# Patient Record
Sex: Female | Born: 1995 | Race: Black or African American | Hispanic: No | Marital: Single | Smoking: Never smoker
Health system: Southern US, Community
[De-identification: ages and names within clinical notes are randomized; demographics above are authoritative.]

## PROBLEM LIST (undated history)

## (undated) ENCOUNTER — Emergency Department (HOSPITAL_BASED_OUTPATIENT_CLINIC_OR_DEPARTMENT_OTHER): Discharge status: Absent without leave | Payer: BLUE CROSS/BLUE SHIELD

## (undated) ENCOUNTER — Inpatient Hospital Stay (HOSPITAL_COMMUNITY): Payer: Self-pay

## (undated) ENCOUNTER — Emergency Department (HOSPITAL_BASED_OUTPATIENT_CLINIC_OR_DEPARTMENT_OTHER): Admission: EM | Payer: Medicaid Other | Source: Home / Self Care

## (undated) DIAGNOSIS — Z789 Other specified health status: Secondary | ICD-10-CM

## (undated) DIAGNOSIS — B009 Herpesviral infection, unspecified: Secondary | ICD-10-CM

## (undated) DIAGNOSIS — F419 Anxiety disorder, unspecified: Secondary | ICD-10-CM

## (undated) HISTORY — PX: MOUTH SURGERY: SHX715

---
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
## 2014-03-28 IMAGING — CR DG CHEST 2V
2 series · 2 of 2 positions shown · non-contrast
Comparison: None.

CLINICAL DATA: Shortness of breath

CHEST - 2 VIEW

[view not recorded (1 of 2)]
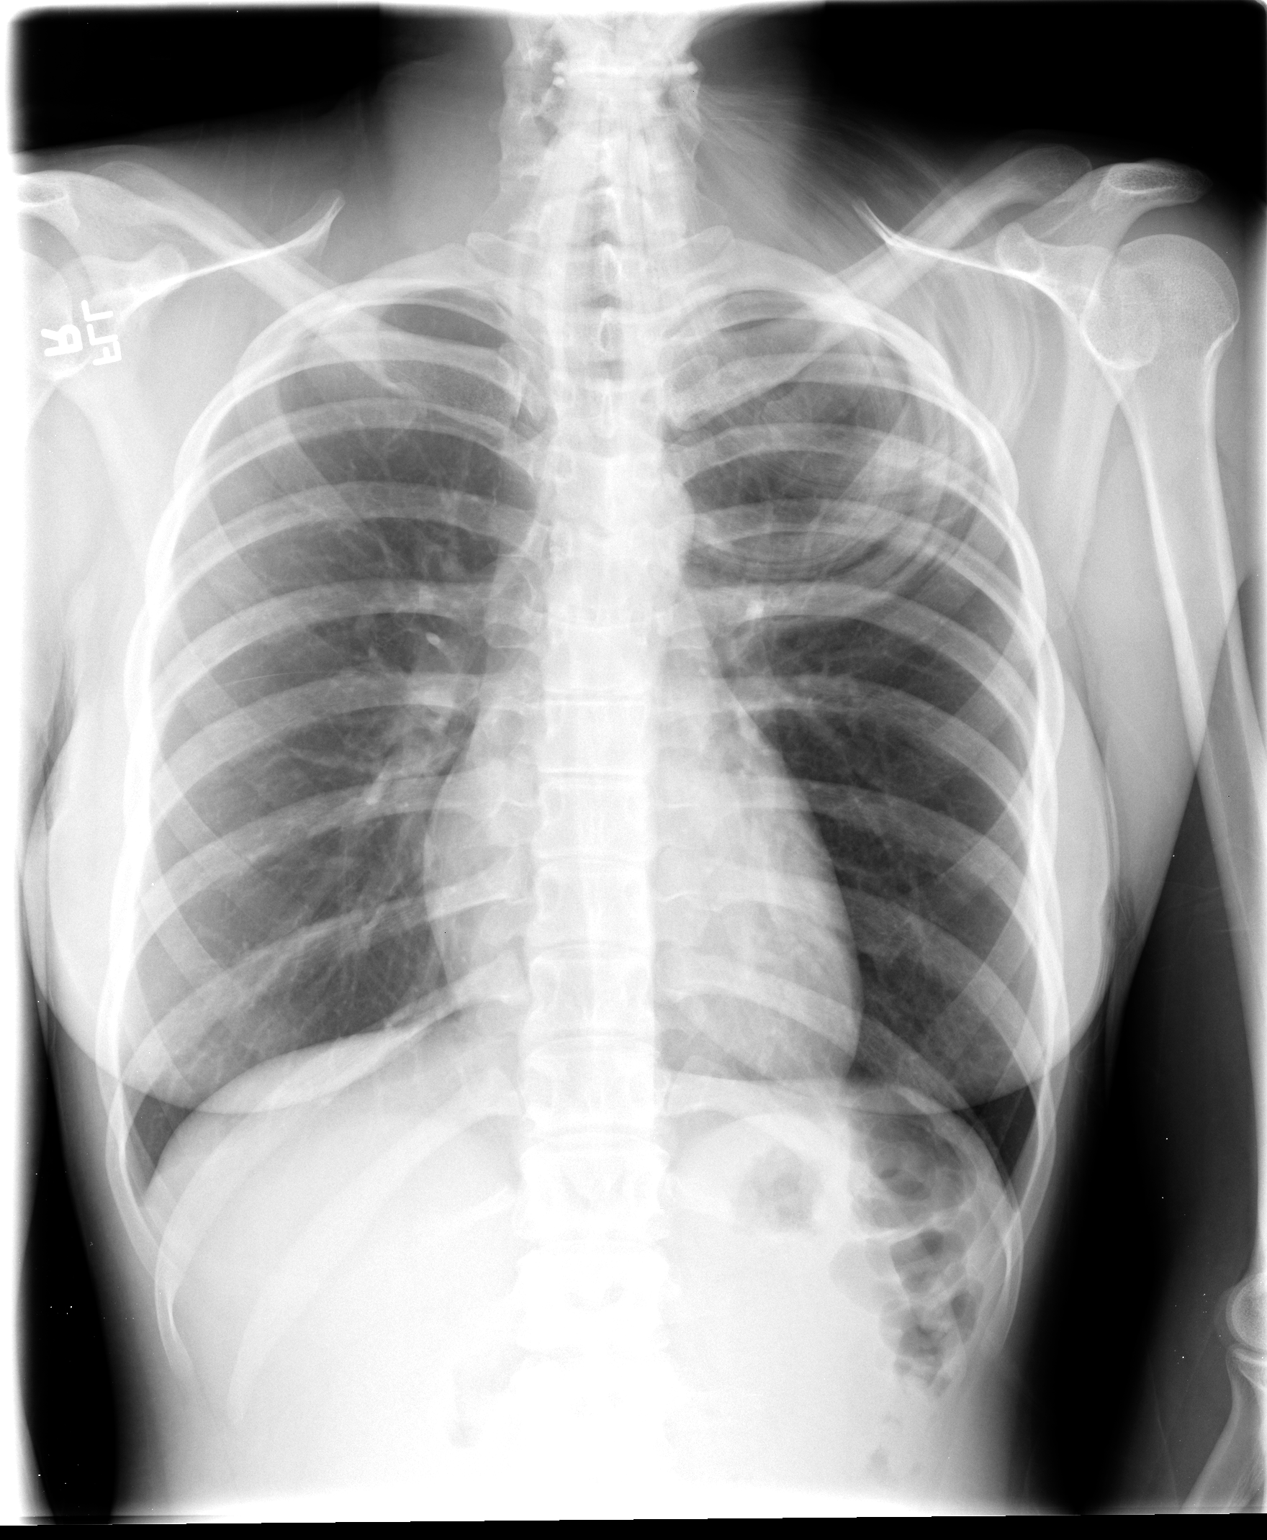

[view not recorded (2 of 2)]
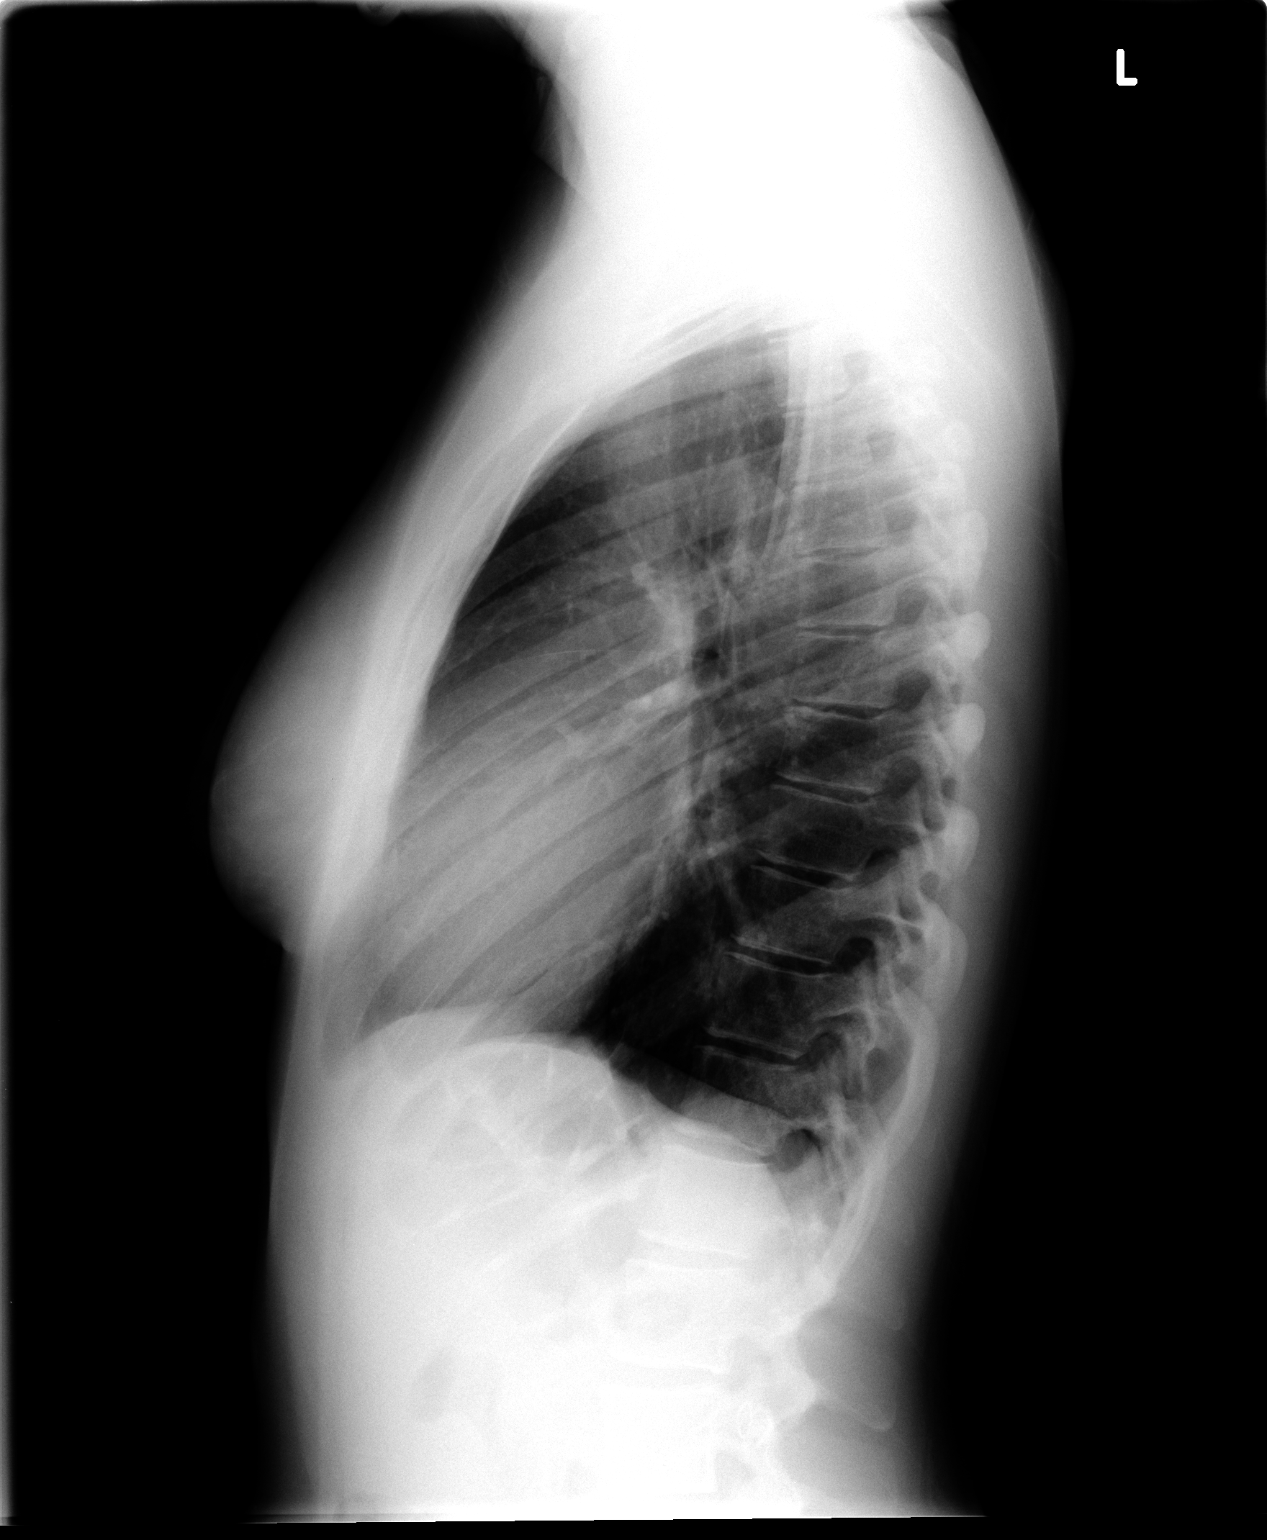

[2 of 2 positions shown; findings below may reference images not displayed]

FINDINGS: The heart and pulmonary vascularity are within normal
limits.  The lungs are clear bilaterally.  No acute bony
abnormality is seen.
IMPRESSION: No acute abnormality noted.

## 2014-06-03 ENCOUNTER — Emergency Department (HOSPITAL_COMMUNITY)
Admission: EM | Admit: 2014-06-03 | Discharge: 2014-06-03 | Disposition: A | Discharge status: Home / Self Care | Payer: BC Managed Care – PPO | Attending: Emergency Medicine | Admitting: Emergency Medicine

## 2014-06-03 DIAGNOSIS — R102 Pelvic and perineal pain: Secondary | ICD-10-CM | POA: Diagnosis present

## 2014-06-03 DIAGNOSIS — Z3202 Encounter for pregnancy test, result negative: Secondary | ICD-10-CM | POA: Insufficient documentation

## 2014-06-03 DIAGNOSIS — N939 Abnormal uterine and vaginal bleeding, unspecified: Secondary | ICD-10-CM

## 2014-06-03 LAB — COMPREHENSIVE METABOLIC PANEL
ALT: 6 U/L (ref 0–35)
ANION GAP: 10 (ref 5–15)
AST: 16 U/L (ref 0–37)
Albumin: 4.1 g/dL (ref 3.5–5.2)
Alkaline Phosphatase: 64 U/L (ref 39–117)
BUN: 9 mg/dL (ref 6–23)
CALCIUM: 9.1 mg/dL (ref 8.4–10.5)
CO2: 27 meq/L (ref 19–32)
CREATININE: 0.86 mg/dL (ref 0.50–1.10)
Chloride: 102 mEq/L (ref 96–112)
GLUCOSE: 82 mg/dL (ref 70–99)
Potassium: 3.9 mEq/L (ref 3.7–5.3)
Sodium: 139 mEq/L (ref 137–147)
TOTAL PROTEIN: 7.4 g/dL (ref 6.0–8.3)
Total Bilirubin: 0.9 mg/dL (ref 0.3–1.2)

## 2014-06-03 LAB — CBC WITH DIFFERENTIAL/PLATELET
Basophils Absolute: 0 10*3/uL (ref 0.0–0.1)
Basophils Relative: 1 % (ref 0–1)
EOS ABS: 0.1 10*3/uL (ref 0.0–0.7)
EOS PCT: 2 % (ref 0–5)
HEMATOCRIT: 34 % — AB (ref 36.0–46.0)
Hemoglobin: 11 g/dL — ABNORMAL LOW (ref 12.0–15.0)
LYMPHS ABS: 1.7 10*3/uL (ref 0.7–4.0)
LYMPHS PCT: 33 % (ref 12–46)
MCH: 26.4 pg (ref 26.0–34.0)
MCHC: 32.4 g/dL (ref 30.0–36.0)
MCV: 81.5 fL (ref 78.0–100.0)
MONO ABS: 0.5 10*3/uL (ref 0.1–1.0)
MONOS PCT: 10 % (ref 3–12)
Neutro Abs: 2.9 10*3/uL (ref 1.7–7.7)
Neutrophils Relative %: 54 % (ref 43–77)
Platelets: 218 10*3/uL (ref 150–400)
RBC: 4.17 MIL/uL (ref 3.87–5.11)
RDW: 14.8 % (ref 11.5–15.5)
WBC: 5.2 10*3/uL (ref 4.0–10.5)

## 2014-06-03 LAB — URINALYSIS, ROUTINE W REFLEX MICROSCOPIC
BILIRUBIN URINE: NEGATIVE
Glucose, UA: NEGATIVE mg/dL
HGB URINE DIPSTICK: NEGATIVE
KETONES UR: NEGATIVE mg/dL
Leukocytes, UA: NEGATIVE
NITRITE: NEGATIVE
PH: 6.5 (ref 5.0–8.0)
Protein, ur: NEGATIVE mg/dL
SPECIFIC GRAVITY, URINE: 1.01 (ref 1.005–1.030)
UROBILINOGEN UA: 0.2 mg/dL (ref 0.0–1.0)

## 2014-06-03 LAB — WET PREP, GENITAL
Trich, Wet Prep: NONE SEEN
YEAST WET PREP: NONE SEEN

## 2014-06-03 LAB — POC URINE PREG, ED: Preg Test, Ur: NEGATIVE

## 2014-06-03 MED ORDER — HYDROCODONE-ACETAMINOPHEN 5-325 MG PO TABS: 2.0000 | ORAL_TABLET | ORAL | Status: DC | PRN

## 2014-06-03 MED ORDER — MEDROXYPROGESTERONE ACETATE 5 MG PO TABS: 5.0000 mg | ORAL_TABLET | Freq: Every day | ORAL | Status: DC

## 2014-06-03 MED ORDER — IBUPROFEN 800 MG PO TABS: 800.0000 mg | ORAL_TABLET | Freq: Three times a day (TID) | ORAL | Status: DC

## 2014-06-03 NOTE — ED Notes (Signed)
Low pelvic pain for 24 hours, no UTI sx. Denies fever, nausea vomiting

## 2014-06-03 NOTE — ED Provider Notes (Signed)
CSN: [id number]     Arrival date & time 06/03/14  1833 History  This chart was scribe for Gilda Creasehristopher J. Anakaren Campion, * by Angelene GiovanniEmmanuella Mensah, ED Scribe. The patient was seen in room APA09/APA09 and the patient's care was started at 8:37 PM.    Chief Complaint  Patient presents with  . Pelvic Pain   The history is provided by the patient. No language interpreter was used.   HPI Comments: Patricia Craig is a 18 y.o. female who presents to the Emergency Department complaining of a sharp constant pelvic pain onset today. She reports associated blood clots after her period that ended 2 days ago. She reports taking an ibuprofen today with no relief. She states that she has been experiencing these symptoms for the past few months.    No past medical history on file. No past surgical history on file. No family history on file. History  Substance Use Topics  . Smoking status: Not on file  . Smokeless tobacco: Not on file  . Alcohol Use: No   OB History    No data available     Review of Systems  Constitutional: Negative for fever.  Gastrointestinal: Negative for nausea and vomiting.  Genitourinary: Positive for pelvic pain.      Allergies  Review of patient's allergies indicates no known allergies.  Home Medications   Prior to Admission medications   Medication Sig Start Date End Date Taking? Authorizing Provider  ALPRAZolam (XANAX) 0.25 MG tablet Take 1 tablet (0.25 mg total) by mouth 3 (three) times daily as needed for anxiety. Patient not taking: Reported on 06/03/2014 10/23/12   Burgess AmorJulie Idol, PA-C   BP 106/68 mmHg  Pulse 57  Temp(Src) 98.3 F (36.8 C) (Oral)  Resp 24  Ht 5\' 2"  (1.575 m)  Wt 119 lb (53.978 kg)  BMI 21.76 kg/m2  SpO2   LMP 05/24/2014 (Approximate) Physical Exam  Constitutional: She is oriented to person, place, and time. She appears well-developed and well-nourished. No distress.  HENT:  Head: Normocephalic and atraumatic.  Right Ear: Hearing normal.   Left Ear: Hearing normal.  Nose: Nose normal.  Mouth/Throat: Oropharynx is clear and moist and mucous membranes are normal.  Eyes: Conjunctivae and EOM are normal. Pupils are equal, round, and reactive to light.  Neck: Normal range of motion. Neck supple.  Cardiovascular: Regular rhythm, S1 normal and S2 normal.  Exam reveals no gallop and no friction rub.   No murmur heard. Pulmonary/Chest: Effort normal and breath sounds normal. No respiratory distress. She exhibits no tenderness.  Abdominal: Soft. Normal appearance and bowel sounds are normal. There is no hepatosplenomegaly. There is tenderness in the suprapubic area. There is no rebound, no guarding, no tenderness at McBurney's point and negative Murphy's sign. No hernia.  Genitourinary: Vagina normal. There is no rash on the right labia. There is no rash on the left labia. Uterus is tender. Uterus is not enlarged. Cervix exhibits no motion tenderness. Right adnexum displays no mass. Left adnexum displays no mass.  Musculoskeletal: Normal range of motion.  Neurological: She is alert and oriented to person, place, and time. She has normal strength. No cranial nerve deficit or sensory deficit. Coordination normal. GCS eye subscore is 4. GCS verbal subscore is 5. GCS motor subscore is 6.  Skin: Skin is warm, dry and intact. No rash noted. No cyanosis.  Psychiatric: She has a normal mood and affect. Her speech is normal and behavior is normal. Thought content normal.  Nursing note and  vitals reviewed.   ED Course  Procedures (including critical care time) DIAGNOSTIC STUDIES:   COORDINATION OF CARE: 8:40 PM- Pt advised of plan for treatment and pt agrees.    Labs Review Labs Reviewed  CBC WITH DIFFERENTIAL - Abnormal; Notable for the following:    Hemoglobin 11.0 (*)    HCT 34.0 (*)    All other components within normal limits  WET PREP, GENITAL  GC/CHLAMYDIA PROBE AMP  COMPREHENSIVE METABOLIC PANEL  URINALYSIS, ROUTINE W REFLEX  MICROSCOPIC  TSH  RPR  HIV ANTIBODY (ROUTINE TESTING)  POC URINE PREG, ED    Imaging Review No results found.   EKG Interpretation None      MDM   Final diagnoses:  None   abnormal uterine bleeding  Patient presents to the ER for evaluation of vaginal bleeding. Patient reports that she had her normal menstrual cycle this past week, leading stopped approximately 2 days ago. She started bleeding again today heavily. She has been passing clots and having severe lower abdominal and pelvic cramping associated with the bleeding. This is unusual for her. No weakness, dizziness, shortness of breath, passing out. Abdominal exam was essentially benign. Pelvic exam did not show cervical motion tenderness or discharge. No active bleeding currently. Lab work normal. Pregnancy negative.  Patient [place]. She was given Vicodin and ibuprofen for pain. She was also given a prescription for Provera. She was counseled that she does not need to take this unless she starts bleeding heavily again. If she starts breathing she should take the entire course.  I personally performed the services described in this documentation, which was scribed in my presence. The recorded information has been reviewed and is accurate.    Gilda Creasehristopher J. Jalexa Pifer, MD 06/03/14 2141

## 2014-06-03 NOTE — ED Notes (Signed)
In and out cath performed, pt tolerated well; urine obtained and sent over to the lab

## 2014-06-03 NOTE — Discharge Instructions (Signed)
Follow-[place] with [place] [facility] [place] [facility] as soon as possible. If bleeding worsens, Philip prescription for Provera and take the entire course. If bleeding significantly worsens, return to the ER.  Abnormal Uterine Bleeding Abnormal uterine bleeding can affect women at various stages in life, including teenagers, women in their reproductive years, pregnant women, and women who have reached menopause. Several kinds of uterine bleeding are considered abnormal, including:  Bleeding or spotting between periods.   Bleeding after sexual intercourse.   Bleeding that is heavier or more than normal.   Periods that last longer than usual.  Bleeding after menopause.  Many cases of abnormal uterine bleeding are minor and simple to treat, while others are more serious. Any type of abnormal bleeding should be evaluated by your health care provider. Treatment will depend on the cause of the bleeding. HOME CARE INSTRUCTIONS Monitor your condition for any changes. The following actions may help to alleviate any discomfort you are experiencing:  Avoid the use of tampons and douches as directed by your health care provider.  Change your pads frequently. You should get regular pelvic exams and Pap tests. Keep all follow-up appointments for diagnostic tests as directed by your health care provider.  SEEK MEDICAL CARE IF:   Your bleeding lasts more than 1 week.   You feel dizzy at times.  SEEK IMMEDIATE MEDICAL CARE IF:   You pass out.   You are changing pads every 15 to 30 minutes.   You have abdominal pain.  You have a fever.   You become sweaty or weak.   You are passing large blood clots from the vagina.   You start to feel nauseous and vomit. MAKE SURE YOU:   Understand these instructions.  Will watch your condition.  Will get help right away if you are not doing well or get worse. Document Released: 07/03/2005 Document Revised: 07/08/2013 Document Reviewed:  01/30/2013 [facility] Patient Information 2015 [employer] [place]. This information is not intended to replace advice given to you by your health care provider. Make sure you discuss any questions you have with your health care provider.

## 2014-06-04 LAB — RPR

## 2014-06-04 LAB — TSH: TSH: 1.1 u[IU]/mL (ref 0.350–4.500)

## 2014-06-04 LAB — HIV ANTIBODY (ROUTINE TESTING W REFLEX): HIV: NONREACTIVE

## 2014-06-05 LAB — GC/CHLAMYDIA PROBE AMP
CT Probe RNA: NEGATIVE
GC Probe RNA: NEGATIVE

## 2017-04-11 ENCOUNTER — Emergency Department (HOSPITAL_BASED_OUTPATIENT_CLINIC_OR_DEPARTMENT_OTHER)
Admission: EM | Admit: 2017-04-11 | Discharge: 2017-04-11 | Disposition: A | Discharge status: Home / Self Care | Payer: BLUE CROSS/BLUE SHIELD | Attending: Emergency Medicine | Admitting: Emergency Medicine

## 2017-04-11 ENCOUNTER — Encounter (HOSPITAL_BASED_OUTPATIENT_CLINIC_OR_DEPARTMENT_OTHER): Payer: Self-pay | Admitting: Emergency Medicine

## 2017-04-11 DIAGNOSIS — Z3A01 Less than 8 weeks gestation of pregnancy: Secondary | ICD-10-CM | POA: Diagnosis not present

## 2017-04-11 DIAGNOSIS — Z791 Long term (current) use of non-steroidal anti-inflammatories (NSAID): Secondary | ICD-10-CM | POA: Diagnosis not present

## 2017-04-11 DIAGNOSIS — Z3491 Encounter for supervision of normal pregnancy, unspecified, first trimester: Secondary | ICD-10-CM

## 2017-04-11 DIAGNOSIS — Z0189 Encounter for other specified special examinations: Secondary | ICD-10-CM | POA: Diagnosis present

## 2017-04-11 DIAGNOSIS — Z3201 Encounter for pregnancy test, result positive: Secondary | ICD-10-CM | POA: Diagnosis not present

## 2017-04-11 DIAGNOSIS — Z79899 Other long term (current) drug therapy: Secondary | ICD-10-CM | POA: Insufficient documentation

## 2017-04-11 LAB — PREGNANCY, URINE: PREG TEST UR: POSITIVE — AB

## 2017-04-11 NOTE — ED Triage Notes (Signed)
Pt reports she missed her menstrual cycle and 4 pos home pregnancy tests and needs to know if it's truly positive.

## 2017-04-11 NOTE — ED Provider Notes (Signed)
[facility]   CSN: [id number] Arrival date & time: 04/11/17  1612     History   Chief Complaint Chief Complaint  Patient presents with  . Missed menstrual cycle    HPI Jerome Otter Mis is a 21 y.o. female.  HPI  21 year old female presents asking for a pregnancy test. She states she took 4 home pregnancy tests and they were all positive but she wanted to confirm. Has never been pregnant before. Has had sore breasts for 2 days and some nausea when at work when she smells cigarette smoke. LMP end of August, should have had her cycle this month. No abdominal pain, vaginal bleeding or dysuria.   History reviewed. No pertinent past medical history.  There are no active problems to display for this patient.   History reviewed. No pertinent surgical history.  OB History    No data available       Home Medications    Prior to Admission medications   Medication Sig Start Date End Date Taking? Authorizing Provider  ALPRAZolam (XANAX) 0.25 MG tablet Take 1 tablet (0.25 mg total) by mouth 3 (three) times daily as needed for anxiety. Patient not taking: Reported on 06/03/2014 10/23/12   Burgess Amor, PA-C  HYDROcodone-acetaminophen (NORCO/VICODIN) 5-325 MG per tablet Take 2 tablets by mouth every 4 (four) hours as needed for moderate pain. 06/03/14   Gilda Crease, MD  ibuprofen (ADVIL,MOTRIN) 800 MG tablet Take 1 tablet (800 mg total) by mouth 3 (three) times daily. 06/03/14   Gilda Crease, MD  medroxyPROGESTERone (PROVERA) 5 MG tablet Take 1 tablet (5 mg total) by mouth daily. If needed for worsening vaginal bleeding 06/03/14   Gilda Crease, MD    Family History No family history on file.  Social History Social History  Substance Use Topics  . Smoking status: Never Smoker  . Smokeless tobacco: Never Used  . Alcohol use No     Allergies   Patient has no known allergies.   Review of Systems Review of Systems    Gastrointestinal: Positive for nausea. Negative for abdominal pain and vomiting.  Genitourinary: Positive for menstrual problem. Negative for dysuria and vaginal bleeding.  All other systems reviewed and are negative.    Physical Exam Updated Vital Signs BP 112/66 (BP Location: Left Arm)   Pulse 68   Temp 99.6 F (37.6 C) (Oral)   Resp 16   Ht  (1.575 m)   Wt 55.8 kg (123 lb)   LMP 03/10/2017   SpO2 100%   BMI 22.50 kg/m   Physical Exam  Constitutional: She is oriented to person, place, and time. She appears well-developed and well-nourished. No distress.  HENT:  Head: Normocephalic and atraumatic.  Right Ear: External ear normal.  Left Ear: External ear normal.  Nose: Nose normal.  Eyes: Right eye exhibits no discharge. Left eye exhibits no discharge.  Cardiovascular: Normal rate, regular rhythm and normal heart sounds.   Pulmonary/Chest: Effort normal and breath sounds normal.  Abdominal: Soft. She exhibits no distension. There is no tenderness.  Neurological: She is alert and oriented to person, place, and time.  Skin: Skin is warm and dry. She is not diaphoretic.  Nursing note and vitals reviewed.    ED Treatments / Results  Labs (all labs ordered are listed, but only abnormal results are displayed) Labs Reviewed  PREGNANCY, URINE - Abnormal; Notable for the following:       Result Value  Preg Test, Ur POSITIVE (*)    All other components within normal limits    EKG  EKG Interpretation None       Radiology No results found.  Procedures Procedures (including critical care time)  Medications Ordered in ED Medications - No data to display   Initial Impression / Assessment and Plan / ED Course  I have reviewed the triage vital signs and the nursing notes.  Pertinent labs & imaging results that were available during my care of the patient were reviewed by me and considered in my medical decision making (see chart for details).     Patient  presents with an otherwise uncomplicated first trimester pregnancy. Her patency test here confirms she is pregnant. No concerning signs or symptoms such as abdominal pain or current vomiting, or vaginal bleeding. Thus, will discharge home with outpatient OB follow-up. Encouraged to start prenatal vitamins. Discussed return precautions.  Final Clinical Impressions(s) / ED Diagnoses   Final diagnoses:  First trimester pregnancy    New Prescriptions New Prescriptions   No medications on file     Pricilla Loveless, MD 04/11/17 1643

## 2017-04-19 DIAGNOSIS — Z3490 Encounter for supervision of normal pregnancy, unspecified, unspecified trimester: Secondary | ICD-10-CM | POA: Insufficient documentation

## 2017-04-20 LAB — OB RESULTS CONSOLE GC/CHLAMYDIA
Chlamydia: NEGATIVE
Gonorrhea: NEGATIVE

## 2017-04-20 LAB — OB RESULTS CONSOLE ABO/RH: RH TYPE: POSITIVE

## 2017-04-20 LAB — OB RESULTS CONSOLE HIV ANTIBODY (ROUTINE TESTING): HIV: NONREACTIVE

## 2017-04-20 LAB — OB RESULTS CONSOLE GBS: STREP GROUP B AG: POSITIVE

## 2017-04-20 LAB — OB RESULTS CONSOLE RPR: RPR: NONREACTIVE

## 2017-04-20 LAB — OB RESULTS CONSOLE ANTIBODY SCREEN: Antibody Screen: NEGATIVE

## 2017-04-20 LAB — OB RESULTS CONSOLE RUBELLA ANTIBODY, IGM: Rubella: IMMUNE

## 2017-04-20 LAB — OB RESULTS CONSOLE HEPATITIS B SURFACE ANTIGEN: Hepatitis B Surface Ag: NEGATIVE

## 2017-04-24 DIAGNOSIS — O99820 Streptococcus B carrier state complicating pregnancy: Secondary | ICD-10-CM | POA: Insufficient documentation

## 2017-04-27 DIAGNOSIS — A6000 Herpesviral infection of urogenital system, unspecified: Secondary | ICD-10-CM | POA: Insufficient documentation

## 2017-06-08 ENCOUNTER — Encounter (HOSPITAL_COMMUNITY): Payer: Self-pay

## 2017-06-08 ENCOUNTER — Inpatient Hospital Stay (HOSPITAL_COMMUNITY)
Admission: AD | Admit: 2017-06-08 | Discharge: 2017-06-08 | Disposition: A | Discharge status: Home / Self Care | Payer: BLUE CROSS/BLUE SHIELD | Source: Ambulatory Visit | Attending: Obstetrics and Gynecology | Admitting: Obstetrics and Gynecology

## 2017-06-08 DIAGNOSIS — Z3A12 12 weeks gestation of pregnancy: Secondary | ICD-10-CM | POA: Diagnosis not present

## 2017-06-08 DIAGNOSIS — B9689 Other specified bacterial agents as the cause of diseases classified elsewhere: Secondary | ICD-10-CM | POA: Diagnosis not present

## 2017-06-08 DIAGNOSIS — O99281 Endocrine, nutritional and metabolic diseases complicating pregnancy, first trimester: Secondary | ICD-10-CM | POA: Diagnosis not present

## 2017-06-08 DIAGNOSIS — O26899 Other specified pregnancy related conditions, unspecified trimester: Secondary | ICD-10-CM

## 2017-06-08 DIAGNOSIS — B009 Herpesviral infection, unspecified: Secondary | ICD-10-CM | POA: Diagnosis not present

## 2017-06-08 DIAGNOSIS — O23591 Infection of other part of genital tract in pregnancy, first trimester: Secondary | ICD-10-CM | POA: Diagnosis not present

## 2017-06-08 DIAGNOSIS — R109 Unspecified abdominal pain: Secondary | ICD-10-CM | POA: Diagnosis present

## 2017-06-08 DIAGNOSIS — E860 Dehydration: Secondary | ICD-10-CM | POA: Insufficient documentation

## 2017-06-08 DIAGNOSIS — Z79899 Other long term (current) drug therapy: Secondary | ICD-10-CM | POA: Diagnosis not present

## 2017-06-08 DIAGNOSIS — Z22330 Carrier of Group B streptococcus: Secondary | ICD-10-CM

## 2017-06-08 DIAGNOSIS — N760 Acute vaginitis: Secondary | ICD-10-CM | POA: Diagnosis not present

## 2017-06-08 HISTORY — DX: Other specified health status: Z78.9

## 2017-06-08 LAB — URINALYSIS, ROUTINE W REFLEX MICROSCOPIC
Bilirubin Urine: NEGATIVE
GLUCOSE, UA: NEGATIVE mg/dL
Hgb urine dipstick: NEGATIVE
Ketones, ur: 80 mg/dL — AB
LEUKOCYTES UA: NEGATIVE
Nitrite: NEGATIVE
PROTEIN: NEGATIVE mg/dL
Specific Gravity, Urine: 1.023 (ref 1.005–1.030)
pH: 7 (ref 5.0–8.0)

## 2017-06-08 LAB — WET PREP, GENITAL
Sperm: NONE SEEN
TRICH WET PREP: NONE SEEN
Yeast Wet Prep HPF POC: NONE SEEN

## 2017-06-08 LAB — POCT PREGNANCY, URINE: Preg Test, Ur: POSITIVE — AB

## 2017-06-08 MED ORDER — ONDANSETRON HCL 4 MG/2ML IJ SOLN
4.0000 mg | Freq: Once | INTRAMUSCULAR | Status: AC
  Administered 2017-06-08: 4 mg via INTRAVENOUS
  Filled 2017-06-08: qty 2

## 2017-06-08 MED ORDER — METRONIDAZOLE 0.75 % VA GEL: 1.0000 | Freq: Every day | VAGINAL | 0 refills | Status: AC

## 2017-06-08 MED ORDER — LACTATED RINGERS IV SOLN
Freq: Once | INTRAVENOUS | Status: AC
  Administered 2017-06-08: 15:00:00 via INTRAVENOUS

## 2017-06-08 NOTE — Discharge Instructions (Signed)

## 2017-06-08 NOTE — MAU Note (Signed)
Patient presents with sharp abdominal pain which started this morning, denies constipation, denies vaginal bleeding.

## 2017-06-08 NOTE — MAU Provider Note (Signed)
History   21 yo G1P0 at 6412 3/7 weeks presented unannounced c/o cramping since this am.  Denies bleeding or d/c, reports adequate fluid intake.  Has struggled with N/V, but able to keep f/f down.  Seen in office 11/12, with normal findings.  Denies dysuria or constipation.  Patient Active Problem List   Diagnosis Date Noted  . 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 Abdominal Pain   HPI:  See above  OB History    Gravida Para Term Preterm AB Living   1             SAB TAB Ectopic Multiple Live Births                  Past Medical History:  Diagnosis Date  . Medical history non-contributory     Past Surgical History:  Procedure Laterality Date  . MOUTH SURGERY      History reviewed. No pertinent family history.  Social History   Tobacco Use  . Smoking status: Never Smoker  . Smokeless tobacco: Never Used  Substance Use Topics  . Alcohol use: No  . Drug use: No    Allergies: No Known Allergies  Medications Prior to Admission  Medication Sig Dispense Refill Last Dose  . ondansetron (ZOFRAN-ODT) 4 MG disintegrating tablet Take 4 mg by mouth every 8 (eight) hours as needed for nausea or vomiting.    Past Week at Unknown time  . Prenatal Vit-Fe Fumarate-FA (PRENATAL MULTIVITAMIN) TABS tablet Take 1 tablet by mouth daily at 12 noon.   06/07/2017 at Unknown time    ROS:  Cramping, nausea, vomiting, nasal congestion Physical Exam   Blood pressure 96/66, pulse 79, temperature 98.6 F (37 C), temperature source Oral, resp. rate 16, height 5\' 2"  (1.575 m), weight 54.5 kg (120 lb 1.9 oz), last menstrual period 03/10/2017.    Physical Exam  Chest clear Heart RRR without murmur Abd gravid, NT, FHR 154 Pelvic--cervix closed, long, NT, small amount thick white d/c, uterus 13 week size, NT Ext WNL  ED Course  Assessment: IUP at 12 3/7 weeks Cramping today  Plan: UA, wet prep   Nigel BridgemanVicki Kee Drudge CNM,  MSN 06/08/2017 3:02 PM   Addendum:  Results for orders placed or performed during the hospital encounter of 06/08/17 (from the past 24 hour(s))  Urinalysis, Routine w reflex microscopic     Status: Abnormal   Collection Time: 06/08/17  2:00 PM  Result Value Ref Range   Color, Urine YELLOW YELLOW   APPearance HAZY (A) CLEAR   Specific Gravity, Urine 1.023 1.005 - 1.030   pH 7.0 5.0 - 8.0   Glucose, UA NEGATIVE NEGATIVE mg/dL   Hgb urine dipstick NEGATIVE NEGATIVE   Bilirubin Urine NEGATIVE NEGATIVE   Ketones, ur 80 (A) NEGATIVE mg/dL   Protein, ur NEGATIVE NEGATIVE mg/dL   Nitrite NEGATIVE NEGATIVE   Leukocytes, UA NEGATIVE NEGATIVE  Pregnancy, urine POC     Status: Abnormal   Collection Time: 06/08/17  2:20 PM  Result Value Ref Range   Preg Test, Ur POSITIVE (A) NEGATIVE  Wet prep, genital     Status: Abnormal   Collection Time: 06/08/17  2:35 PM  Result Value Ref Range   Yeast Wet Prep HPF POC NONE SEEN NONE SEEN   Trich, Wet Prep NONE SEEN NONE SEEN   Clue Cells Wet Prep HPF POC PRESENT (A) NONE SEEN   WBC, Wet Prep  HPF POC MANY (A) NONE SEEN   Sperm NONE SEEN     Impression: IUP at 12 3/7 weeks Dehydration BV  Plan: IV hydration with 1 bag LR Zofran 4 mg IV x 1 Rx Metrogel 0.75%, 1 applicatorful per vagina x 5 nights--due to possible intolerance to po Metronidazole. D/c home after IV hydration. [facility].  Reviewed low risk Panorama result, with gender results given to patient's mother for gender reveal later.  Nigel BridgemanVicki Bergen Magner, CNM 06/08/17 3:15p

## 2017-07-17 NOTE — L&D Delivery Note (Signed)
Delivery Note Patient pushed for less than 5 minutes after she was noted to be C/C/+2.  At 8:02 PM a viable and healthy female was delivered via Vaginal, Spontaneous (Presentation OA)   APGAR: 8, 9; weight pending over intact perineum. Head, shoulders and body easily delivered.  Delayed cord clamping done then cut. Placenta spontaneously delivered intact, 3 vessels noted.   Uterine atony alleviated by massage and IV pitocin. Periclitoral laceration noted and was infiltrated with 1% lidocaine and repaired with 3-0 chromic.  Anesthesia: Local 1% lidocaine plain Episiotomy: None Lacerations:  Peri-clitoral Suture Repair: 3.0 chromic Est. Blood Loss (mL):  300  Mom to postpartum.  Baby to Couplet care / Skin to Skin.  Patricia Craig 12/05/2017, 8:21 PM

## 2017-09-23 ENCOUNTER — Emergency Department (HOSPITAL_BASED_OUTPATIENT_CLINIC_OR_DEPARTMENT_OTHER)
Admission: EM | Admit: 2017-09-23 | Discharge: 2017-09-23 | Disposition: A | Discharge status: Home / Self Care | Payer: BLUE CROSS/BLUE SHIELD | Attending: Emergency Medicine | Admitting: Emergency Medicine

## 2017-09-23 ENCOUNTER — Encounter (HOSPITAL_BASED_OUTPATIENT_CLINIC_OR_DEPARTMENT_OTHER): Payer: Self-pay | Admitting: Emergency Medicine

## 2017-09-23 ENCOUNTER — Other Ambulatory Visit: Payer: Self-pay

## 2017-09-23 DIAGNOSIS — Z79899 Other long term (current) drug therapy: Secondary | ICD-10-CM | POA: Insufficient documentation

## 2017-09-23 DIAGNOSIS — Z3A00 Weeks of gestation of pregnancy not specified: Secondary | ICD-10-CM | POA: Diagnosis not present

## 2017-09-23 DIAGNOSIS — H6122 Impacted cerumen, left ear: Secondary | ICD-10-CM | POA: Diagnosis not present

## 2017-09-23 DIAGNOSIS — O9989 Other specified diseases and conditions complicating pregnancy, childbirth and the puerperium: Secondary | ICD-10-CM | POA: Insufficient documentation

## 2017-09-23 NOTE — ED Triage Notes (Signed)
L ear pain x 2 days. Pt is 7 months pregnant.

## 2017-09-23 NOTE — ED Provider Notes (Signed)
[facility] EMERGENCY DEPARTMENT Provider Note   CSN: [id number] Arrival date & time: 09/23/17  1527     History   Chief Complaint Chief Complaint  Patient presents with  . Otalgia    HPI Patricia Craig is a 22 y.o. female who is currently 7 months pregnant who presents to the emergency department today for left ear fullness.  Patient states that over the last 2 days she has noticed that her hearing is muffled out of this year.  She states she has suffered from cerumen impactions in the past and feels like this is similar to the same.  She does use Q-tips often and notes that this usually precipitates her symptoms.  She denies any pain associated with this.  There is no dizziness, vertigo, headache, visual changes, sinus congestion, sore throat, cough, ear drainage, tinnitus, recent swimming, history of barotrauma or trauma.  He makes the patient symptoms better or worse.  No interventions attempted prior to arrival.  HPI  Past Medical History:  Diagnosis Date  . Medical history non-contributory     Patient Active Problem List   Diagnosis Date Noted  . 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 MOUTH SURGERY      OB History    Gravida Para Term Preterm AB Living   1             SAB TAB Ectopic Multiple Live Births                   Home Medications    Prior to Admission medications   Medication Sig Start Date End Date Taking? Authorizing Provider  ondansetron (ZOFRAN-ODT) 4 MG disintegrating tablet Take 4 mg by mouth every 8 (eight) hours as needed for nausea or vomiting.  05/21/17   [provider]  Prenatal Vit-Fe Fumarate-FA (PRENATAL MULTIVITAMIN) TABS tablet Take 1 tablet by mouth daily at 12 noon.    [provider]    Family History No family history on file.  Social History Social History   Tobacco Use  . Smoking status: Never Smoker  . Smokeless tobacco:  Never Used  Substance Use Topics  . Alcohol use: No  . Drug use: No     Allergies   Patient has no known allergies.   Review of Systems Review of Systems  All other systems reviewed and are negative.    Physical Exam Updated Vital Signs BP 114/78 (BP Location: Left Arm)   Pulse 70   Temp 98.5 F (36.9 C) (Oral)   Resp 16   LMP 03/10/2017   SpO2 99%   Physical Exam  Constitutional: She appears well-developed and well-nourished. No distress.  HENT:  Head: Normocephalic and atraumatic.  Right Ear: Hearing, tympanic membrane, external ear and ear canal normal. No foreign bodies. No mastoid tenderness. Tympanic membrane is not injected, not perforated, not erythematous, not retracted and not bulging.  Left Ear: Hearing and external ear normal. No mastoid tenderness. Tympanic membrane is not injected, not perforated, not erythematous, not retracted and not bulging.  Nose: Nose normal. No mucosal edema, rhinorrhea, sinus tenderness, septal deviation or nasal septal hematoma.  No foreign bodies. Right sinus exhibits no maxillary sinus tenderness and no frontal sinus tenderness. Left sinus exhibits no maxillary sinus tenderness and no frontal sinus tenderness.  Cerumen impaction of the left ear canal.  No drainage or swelling.  Eyes: Conjunctivae, EOM and lids are  normal. Pupils are equal, round, and reactive to light. Right eye exhibits no discharge. Left eye exhibits no discharge. Right conjunctiva is not injected. Left conjunctiva is not injected.  Neck: Trachea normal, normal range of motion, full passive range of motion without pain and phonation normal. Neck supple. No spinous process tenderness and no muscular tenderness present. No neck rigidity. No tracheal deviation and normal range of motion present.  Cardiovascular: Normal rate and regular rhythm.  Pulmonary/Chest: Effort normal and breath sounds normal. No stridor. She has no wheezes.  Abdominal: Soft.  Gravid abdomen    Lymphadenopathy:       Head (right side): No submental, no submandibular, no tonsillar, no preauricular, no posterior auricular and no occipital adenopathy present.       Head (left side): No submental, no submandibular, no tonsillar, no preauricular, no posterior auricular and no occipital adenopathy present.    She has no cervical adenopathy.  Neurological: She is alert.  Skin: Skin is warm and dry. No rash noted.  Psychiatric: She has a normal mood and affect.  Nursing note and vitals reviewed.    ED Treatments / Results  Labs (all labs ordered are listed, but only abnormal results are displayed) Labs Reviewed - No data to display  EKG  EKG Interpretation None       Radiology No results found.  Procedures .Ear Cerumen Removal Date/Time: 09/24/2017 1:00 AM Performed by: Jacinto Halim, PA-C Authorized by: Jacinto Halim, PA-C   Consent:    Consent obtained:  Verbal   Consent given by:  Patient   Risks discussed:  Bleeding, dizziness, infection, incomplete removal, TM perforation and pain   Alternatives discussed:  No treatment Procedure details:    Location:  L ear   Procedure type: irrigation   Post-procedure details:    Inspection:  TM intact   Hearing quality:  Normal   Patient tolerance of procedure:  Tolerated well, no immediate complications   (including critical care time)  Medications Ordered in ED Medications - No data to display   Initial Impression / Assessment and Plan / ED Course  I have reviewed the triage vital signs and the nursing notes.  Pertinent labs & imaging results that were available during my care of the patient were reviewed by me and considered in my medical decision making (see chart for details).     22 y.o. female with left cerumen impaction.  After irrigation patient symptoms resolved.  There is no evidence of acute otitis media.  No edema or drainage from the ear canal.  Patient without headache or elevated blood  pressure.  No headache and blood pressure within normal limits. No abdominal TTP. No concern with pregnancy at this time. Ear care instructions provided. I advised the patient to follow-up with PCP or obgyn this week. Specific return precautions discussed. Time was given for all questions to be answered. The patient verbalized understanding and agreement with plan. The patient appears safe for discharge home.  Final Clinical Impressions(s) / ED Diagnoses   Final diagnoses:  Impacted cerumen of left ear    ED Discharge Orders    None       Jacinto Halim, Cordelia Poche 09/24/17 0104    Alvira Monday, MD 09/24/17 [phone]

## 2017-09-23 NOTE — ED Notes (Signed)
Small amt of wax removed from ear via flush with warm water; pt verbalized immediate relief.

## 2017-09-23 NOTE — Discharge Instructions (Signed)
Please read and follow all provided instructions.  You were seen here today for ear wax impaction  Home Instructions: Please do not insert anything into your ear that is smaller than your elbow.  This includes QTips, keys, hair pins, etc.    If your ears produce extra wax, you can help reduce the build up by:  1) Allow soapy water to drip down into your ear canals when you wash your hair (it can dissolve the wax the same way that dishwashing liquid dissolves grease), and/or  2) Mix Hydrogen Peroxide half-and-half with water (DO NOT USE HYDROGEN PEROXIDE WITHOUT DILUTING IT WITH WATER as it can burn the skin in your ear); pour a little of this mixture into each ear canal while in the shower. Do this 2-3 times each week.  If your ears are itchy, place several drops of Sweet Oil into each canal to soothe the itching.  If it's not effective, place a small amount of hydrocortisone ointment on the tip of your pinky finger and rub it gently in the ear canal.  The heat of your body will melt the ointment, allowing it to spread in your ear canal and reduce the itching.  Follow-up instructions: Please follow-up with your OBGYN or primary care provider for further evaluation of symptoms and treatment.   Additional Information:  Your vital signs today were: BP 114/78 (BP Location: Left Arm)    Pulse 70    Temp 98.5 F (36.9 C) (Oral)    Resp 16    LMP 03/10/2017    SpO2 99%  If your blood pressure (BP) was elevated above 135/85 this visit, please have this repeated by your doctor within one month. ---------------

## 2017-12-04 ENCOUNTER — Encounter (HOSPITAL_COMMUNITY): Payer: Self-pay

## 2017-12-04 ENCOUNTER — Inpatient Hospital Stay (HOSPITAL_COMMUNITY)
Admission: AD | Admit: 2017-12-04 | Discharge: 2017-12-04 | Disposition: A | Discharge status: Home / Self Care | Payer: BLUE CROSS/BLUE SHIELD | Source: Ambulatory Visit | Attending: Obstetrics and Gynecology | Admitting: Obstetrics and Gynecology

## 2017-12-04 DIAGNOSIS — Z3A38 38 weeks gestation of pregnancy: Secondary | ICD-10-CM

## 2017-12-04 DIAGNOSIS — Z3483 Encounter for supervision of other normal pregnancy, third trimester: Secondary | ICD-10-CM | POA: Diagnosis not present

## 2017-12-04 DIAGNOSIS — O99824 Streptococcus B carrier state complicating childbirth: Secondary | ICD-10-CM | POA: Diagnosis not present

## 2017-12-04 LAB — COMPREHENSIVE METABOLIC PANEL
ALT: 7 U/L — ABNORMAL LOW (ref 14–54)
AST: 20 U/L (ref 15–41)
Albumin: 3 g/dL — ABNORMAL LOW (ref 3.5–5.0)
Alkaline Phosphatase: 85 U/L (ref 38–126)
Anion gap: 11 (ref 5–15)
BUN: 13 mg/dL (ref 6–20)
CO2: 21 mmol/L — ABNORMAL LOW (ref 22–32)
CREATININE: 0.91 mg/dL (ref 0.44–1.00)
Calcium: 8.4 mg/dL — ABNORMAL LOW (ref 8.9–10.3)
Chloride: 103 mmol/L (ref 101–111)
Glucose, Bld: 103 mg/dL — ABNORMAL HIGH (ref 65–99)
POTASSIUM: 3.6 mmol/L (ref 3.5–5.1)
Sodium: 135 mmol/L (ref 135–145)
Total Bilirubin: 0.7 mg/dL (ref 0.3–1.2)
Total Protein: 6.1 g/dL — ABNORMAL LOW (ref 6.5–8.1)

## 2017-12-04 LAB — PROTEIN / CREATININE RATIO, URINE
CREATININE, URINE: 233 mg/dL
PROTEIN CREATININE RATIO: 0.04 mg/mg{creat} (ref 0.00–0.15)
Total Protein, Urine: 10 mg/dL

## 2017-12-04 LAB — CBC
HCT: 27.5 % — ABNORMAL LOW (ref 36.0–46.0)
Hemoglobin: 8.9 g/dL — ABNORMAL LOW (ref 12.0–15.0)
MCH: 26.7 pg (ref 26.0–34.0)
MCHC: 32.4 g/dL (ref 30.0–36.0)
MCV: 82.6 fL (ref 78.0–100.0)
PLATELETS: 156 10*3/uL (ref 150–400)
RBC: 3.33 MIL/uL — ABNORMAL LOW (ref 3.87–5.11)
RDW: 14.4 % (ref 11.5–15.5)
WBC: 6.9 10*3/uL (ref 4.0–10.5)

## 2017-12-04 LAB — URIC ACID: URIC ACID, SERUM: 5.9 mg/dL (ref 2.3–6.6)

## 2017-12-04 NOTE — MAU Note (Signed)
Pt said she took a long walk at work today and started having ctx, about 6-7 minutes apart. Pain 4/10. No bleeding or LOF + FM

## 2017-12-04 NOTE — MAU Provider Note (Addendum)
  History     CSN: [id number]  Arrival date and time: 12/04/17 1627   None     Chief Complaint  Patient presents with  . Contractions  . pelvic pressure   HPI Pt presents after taking a walk at work today and started having contractions every 6 min rated at 4/10.  Denies VB, LOF, HA, visual changes or RUQ pain.  Reports +FM.  OB History    Gravida  1   Para      Term      Preterm      AB      Living        SAB      TAB      Ectopic      Multiple      Live Births              Past Medical History:  Diagnosis Date  . Medical history non-contributory     Past Surgical History:  Procedure Laterality Date  . MOUTH SURGERY      No family history on file.  Social History   Tobacco Use  . Smoking status: Never Smoker  . Smokeless tobacco: Never Used  Substance Use Topics  . Alcohol use: No  . Drug use: No    Allergies: No Known Allergies  Medications Prior to Admission  Medication Sig Dispense Refill Last Dose  . ondansetron (ZOFRAN-ODT) 4 MG disintegrating tablet Take 4 mg by mouth every 8 (eight) hours as needed for nausea or vomiting.    Past Week at Unknown time  . Prenatal Vit-Fe Fumarate-FA (PRENATAL MULTIVITAMIN) TABS tablet Take 1 tablet by mouth daily at 12 noon.   06/07/2017 at Unknown time    Review of Systems Physical Exam   Blood pressure 133/82, pulse 74, temperature 98.8 F (37.1 C), temperature source Oral, resp. rate 18, last menstrual period 03/10/2017.  Physical Exam Lungs CTA CV RRR Abd gravid, NT Ext no calf tenderness VE by RN closed FHT 130, + accels, mod variability, no decels Toco q 5-6 min  MAU Course  Procedures  GHTN labs with UPCR  Assessment and Plan  P0 at 38 3/7wks presenting for labor check but has elevated BP.  Will check labs and UPCR.  Fetal status cat 1.  Purcell Nails 12/04/2017, 5:31 PM

## 2017-12-05 ENCOUNTER — Other Ambulatory Visit: Payer: Self-pay

## 2017-12-05 ENCOUNTER — Encounter (HOSPITAL_COMMUNITY): Payer: Self-pay

## 2017-12-05 ENCOUNTER — Inpatient Hospital Stay (HOSPITAL_COMMUNITY)
Admission: AD | Admit: 2017-12-05 | Discharge: 2017-12-07 | DRG: 807 | Disposition: A | Discharge status: Home / Self Care | Payer: BLUE CROSS/BLUE SHIELD | Source: Ambulatory Visit | Attending: Obstetrics and Gynecology | Admitting: Obstetrics and Gynecology

## 2017-12-05 DIAGNOSIS — O99824 Streptococcus B carrier state complicating childbirth: Principal | ICD-10-CM | POA: Diagnosis present

## 2017-12-05 DIAGNOSIS — Z3A38 38 weeks gestation of pregnancy: Secondary | ICD-10-CM

## 2017-12-05 DIAGNOSIS — Z3483 Encounter for supervision of other normal pregnancy, third trimester: Secondary | ICD-10-CM | POA: Diagnosis present

## 2017-12-05 HISTORY — DX: Anxiety disorder, unspecified: F41.9

## 2017-12-05 HISTORY — DX: Herpesviral infection, unspecified: B00.9

## 2017-12-05 LAB — CBC
HCT: 30.9 % — ABNORMAL LOW (ref 36.0–46.0)
Hemoglobin: 9.8 g/dL — ABNORMAL LOW (ref 12.0–15.0)
MCH: 25.8 pg — AB (ref 26.0–34.0)
MCHC: 31.7 g/dL (ref 30.0–36.0)
MCV: 81.3 fL (ref 78.0–100.0)
PLATELETS: 158 10*3/uL (ref 150–400)
RBC: 3.8 MIL/uL — ABNORMAL LOW (ref 3.87–5.11)
RDW: 14.6 % (ref 11.5–15.5)
WBC: 8.6 10*3/uL (ref 4.0–10.5)

## 2017-12-05 LAB — TYPE AND SCREEN
ABO/RH(D): O POS
ANTIBODY SCREEN: NEGATIVE

## 2017-12-05 LAB — POCT FERN TEST: POCT Fern Test: NEGATIVE

## 2017-12-05 LAB — ABO/RH: ABO/RH(D): O POS

## 2017-12-05 MED ORDER — FENTANYL CITRATE (PF) 100 MCG/2ML IJ SOLN
100.0000 ug | INTRAMUSCULAR | Status: DC | PRN
  Administered 2017-12-05 (×2): 100 ug via INTRAVENOUS
  Filled 2017-12-05: qty 2

## 2017-12-05 MED ORDER — FENTANYL 2.5 MCG/ML BUPIVACAINE 1/10 % EPIDURAL INFUSION (WH - ANES)
14.0000 mL/h | INTRAMUSCULAR | Status: DC | PRN
  Filled 2017-12-05: qty 100

## 2017-12-05 MED ORDER — DIPHENHYDRAMINE HCL 50 MG/ML IJ SOLN: 12.5000 mg | INTRAMUSCULAR | Status: DC | PRN

## 2017-12-05 MED ORDER — OXYTOCIN BOLUS FROM INFUSION
500.0000 mL | Freq: Once | INTRAVENOUS | Status: AC
  Administered 2017-12-05: 500 mL via INTRAVENOUS

## 2017-12-05 MED ORDER — FENTANYL CITRATE (PF) 100 MCG/2ML IJ SOLN
INTRAMUSCULAR | Status: AC
  Filled 2017-12-05: qty 2

## 2017-12-05 MED ORDER — OXYCODONE-ACETAMINOPHEN 5-325 MG PO TABS
1.0000 | ORAL_TABLET | ORAL | Status: DC | PRN
  Administered 2017-12-07: 1 via ORAL

## 2017-12-05 MED ORDER — PENICILLIN G POT IN DEXTROSE 60000 UNIT/ML IV SOLN
3.0000 10*6.[IU] | INTRAVENOUS | Status: DC
  Filled 2017-12-05 (×14): qty 50

## 2017-12-05 MED ORDER — PHENYLEPHRINE 40 MCG/ML (10ML) SYRINGE FOR IV PUSH (FOR BLOOD PRESSURE SUPPORT)
80.0000 ug | PREFILLED_SYRINGE | INTRAVENOUS | Status: DC | PRN
  Filled 2017-12-05: qty 5

## 2017-12-05 MED ORDER — OXYCODONE-ACETAMINOPHEN 5-325 MG PO TABS: 2.0000 | ORAL_TABLET | ORAL | Status: DC | PRN

## 2017-12-05 MED ORDER — ZOLPIDEM TARTRATE 5 MG PO TABS: 5.0000 mg | ORAL_TABLET | Freq: Every evening | ORAL | Status: DC | PRN

## 2017-12-05 MED ORDER — ACETAMINOPHEN 325 MG PO TABS: 650.0000 mg | ORAL_TABLET | ORAL | Status: DC | PRN

## 2017-12-05 MED ORDER — IBUPROFEN 600 MG PO TABS
600.0000 mg | ORAL_TABLET | Freq: Four times a day (QID) | ORAL | Status: DC
  Administered 2017-12-05 – 2017-12-07 (×7): 600 mg via ORAL
  Filled 2017-12-05 (×6): qty 1

## 2017-12-05 MED ORDER — DIPHENHYDRAMINE HCL 25 MG PO CAPS: 25.0000 mg | ORAL_CAPSULE | Freq: Four times a day (QID) | ORAL | Status: DC | PRN

## 2017-12-05 MED ORDER — LACTATED RINGERS IV SOLN: 500.0000 mL | Freq: Once | INTRAVENOUS | Status: DC

## 2017-12-05 MED ORDER — SOD CITRATE-CITRIC ACID 500-334 MG/5ML PO SOLN: 30.0000 mL | ORAL | Status: DC | PRN

## 2017-12-05 MED ORDER — PHENYLEPHRINE 40 MCG/ML (10ML) SYRINGE FOR IV PUSH (FOR BLOOD PRESSURE SUPPORT)
80.0000 ug | PREFILLED_SYRINGE | INTRAVENOUS | Status: DC | PRN
  Filled 2017-12-05: qty 10
  Filled 2017-12-05: qty 5

## 2017-12-05 MED ORDER — ONDANSETRON HCL 4 MG/2ML IJ SOLN: 4.0000 mg | Freq: Four times a day (QID) | INTRAMUSCULAR | Status: DC | PRN

## 2017-12-05 MED ORDER — METHYLERGONOVINE MALEATE 0.2 MG PO TABS: 0.2000 mg | ORAL_TABLET | ORAL | Status: DC | PRN

## 2017-12-05 MED ORDER — OXYCODONE-ACETAMINOPHEN 5-325 MG PO TABS
1.0000 | ORAL_TABLET | ORAL | Status: DC | PRN
  Filled 2017-12-05: qty 1

## 2017-12-05 MED ORDER — WITCH HAZEL-GLYCERIN EX PADS: 1.0000 "application " | MEDICATED_PAD | CUTANEOUS | Status: DC | PRN

## 2017-12-05 MED ORDER — SODIUM CHLORIDE 0.9 % IV SOLN
5.0000 10*6.[IU] | Freq: Once | INTRAVENOUS | Status: AC
  Administered 2017-12-05: 5 10*6.[IU] via INTRAVENOUS
  Filled 2017-12-05: qty 5

## 2017-12-05 MED ORDER — DIBUCAINE 1 % RE OINT
1.0000 "application " | TOPICAL_OINTMENT | RECTAL | Status: DC | PRN
  Filled 2017-12-05: qty 28

## 2017-12-05 MED ORDER — SENNOSIDES-DOCUSATE SODIUM 8.6-50 MG PO TABS
2.0000 | ORAL_TABLET | ORAL | Status: DC
  Administered 2017-12-05 – 2017-12-06 (×2): 2 via ORAL
  Filled 2017-12-05 (×2): qty 2

## 2017-12-05 MED ORDER — LIDOCAINE HCL (PF) 1 % IJ SOLN
30.0000 mL | INTRAMUSCULAR | Status: DC | PRN
  Administered 2017-12-05: 30 mL via SUBCUTANEOUS
  Filled 2017-12-05: qty 30

## 2017-12-05 MED ORDER — EPHEDRINE 5 MG/ML INJ
10.0000 mg | INTRAVENOUS | Status: DC | PRN
  Filled 2017-12-05: qty 2

## 2017-12-05 MED ORDER — LACTATED RINGERS IV SOLN: 500.0000 mL | INTRAVENOUS | Status: DC | PRN

## 2017-12-05 MED ORDER — LACTATED RINGERS IV SOLN
INTRAVENOUS | Status: DC
  Administered 2017-12-05: 16:00:00 via INTRAVENOUS

## 2017-12-05 MED ORDER — PRENATAL MULTIVITAMIN CH
1.0000 | ORAL_TABLET | Freq: Every day | ORAL | Status: DC
  Administered 2017-12-06 – 2017-12-07 (×2): 1 via ORAL
  Filled 2017-12-05 (×2): qty 1

## 2017-12-05 MED ORDER — COCONUT OIL OIL
1.0000 "application " | TOPICAL_OIL | Status: DC | PRN
  Filled 2017-12-05: qty 120

## 2017-12-05 MED ORDER — ONDANSETRON HCL 4 MG PO TABS: 4.0000 mg | ORAL_TABLET | ORAL | Status: DC | PRN

## 2017-12-05 MED ORDER — TETANUS-DIPHTH-ACELL PERTUSSIS 5-2.5-18.5 LF-MCG/0.5 IM SUSP
0.5000 mL | Freq: Once | INTRAMUSCULAR | Status: DC
  Filled 2017-12-05: qty 0.5

## 2017-12-05 MED ORDER — OXYTOCIN 40 UNITS IN LACTATED RINGERS INFUSION - SIMPLE MED
2.5000 [IU]/h | INTRAVENOUS | Status: DC
  Filled 2017-12-05: qty 1000

## 2017-12-05 MED ORDER — SIMETHICONE 80 MG PO CHEW: 80.0000 mg | CHEWABLE_TABLET | ORAL | Status: DC | PRN

## 2017-12-05 MED ORDER — OXYTOCIN 40 UNITS IN LACTATED RINGERS INFUSION - SIMPLE MED: 2.5000 [IU]/h | INTRAVENOUS | Status: DC | PRN

## 2017-12-05 MED ORDER — ONDANSETRON HCL 4 MG/2ML IJ SOLN: 4.0000 mg | INTRAMUSCULAR | Status: DC | PRN

## 2017-12-05 MED ORDER — METHYLERGONOVINE MALEATE 0.2 MG/ML IJ SOLN: 0.2000 mg | INTRAMUSCULAR | Status: DC | PRN

## 2017-12-05 MED ORDER — BENZOCAINE-MENTHOL 20-0.5 % EX AERO
1.0000 "application " | INHALATION_SPRAY | CUTANEOUS | Status: DC | PRN
  Filled 2017-12-05: qty 56

## 2017-12-05 NOTE — H&P (Signed)
HONESTII MARTON is a 22 y.o. female, G1P0000 at 38.1 weeks, presenting for IUP in spontaneous labor, EDD 12/18/2017. Pt endorse +FM, contractions that are 7/10 on pain scale and becoming more intense, pt endorses having small about of bloody show. Denies leakage of fluids. RN did a ferning test, which was neg, but after vaginal exam RN report using lubrication then pt stated she felt something leak out. Pt endorses struggling to make it through the contractions, but appears content and talking on the phone after contraction stop.   Patient Active Problem List   Diagnosis Date Noted  . 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   EDC of 12/15/2017 was established by LMP 03/10/2017.   Anatomy scan:  21.3 weeks, with normal findings and an anterior placenta.    Additional [place] evaluations:  None  Significant prenatal events:  HSV +    Last evaluation: 12/05/2017  OB History    Gravida  1   Para      Term      Preterm      AB      Living        SAB      TAB      Ectopic      Multiple      Live Births             Past Medical History:  Diagnosis Date  . Medical history non-contributory    Past Surgical History:  Procedure Laterality Date  . MOUTH SURGERY     Family History: family history is not on file. Social History:  reports that she has never smoked. She has never used smokeless tobacco. She reports that she does not drink alcohol or use drugs.   Prenatal Transfer Tool  Maternal Diabetes: No Genetic Screening: Normal Maternal Ultrasounds/Referrals: Normal Fetal Ultrasounds or other Referrals:  None Maternal Substance Abuse:  No Significant Maternal  Medications:  None Significant Maternal Lab Results: Lab values include: Group B Strep positive, Other: HSV+  TDAP 09/19/2017, given Flu declined  ROS:   Review of Systems  Constitutional: Negative.   HENT: Negative.   Eyes: Negative.   Respiratory: Negative.   Cardiovascular: Negative.   Gastrointestinal: Positive for abdominal pain.  Genitourinary: Negative.   Musculoskeletal: Negative.   Skin: Negative.   Neurological: Negative.   Endo/Heme/Allergies: Negative.   Psychiatric/Behavioral: Negative.   All other systems reviewed and are negative.   No Known Allergies   Dilation: 2 Effacement (%): 80 Station: -2 Exam by:: jaton burgess rnc Height  (1.575 m), weight 69.7 kg (153 lb 12 oz), last menstrual period 03/10/2017.  Chest clear Heart RRR without murmur Abd gravid, NT, FH gravida equal to dates Pelvic: adequate, bloody show, speculum exam shows no hsv lesion present.  Ext: no edema  FHR: Category 1, moderate variability,  UCs:  3-5 mins, lasting 60 sec, moderate to palpate.   Prenatal labs: ABO, Rh:  0+ Antibody:  neg  Rubella:   immune RPR:   neg HBsAg:   neg HIV:   non-reactive GBS:  positive in urine  Sickle cell/Hgb electrophoresis:  AA Pap:  2017-nomral GC:  neg Chlamydia:  neg Genetic screenings:  Panoroma, BB, negative  Glucola:  neg Hgb 34.8 at NOB, 29.8 at 28 weeks    Results for orders placed or performed during the hospital encounter of 12/05/17 (from the past 24 hour(s))  POCT fern test     Status: None   Collection Time: 12/05/17  2:12 PM  Result Value Ref Range   POCT Fern Test Negative = intact amniotic membranes      Assessment/Plan: EULALAH RUPERT is a 22 y.o. female, G1P0000 at 38.1 weeks, presenting for IUP in spontaneous labor, EDD 12/18/2017.  Plan: Admit to Birthing Suite per consult with Dr Normand Sloop Routine [facility] orders Pain med/epidural prn: Attempting to labor without pain meds.  PCN G for GBS  prophylaxis HSV: not on prophylactic antiviral, no active lesions  Anticipate labor progress   Marvyn Torrez NP-C, CNM, MSN 12/05/2017, 2:01 PM

## 2017-12-05 NOTE — Progress Notes (Signed)
   12/05/17 1352 12/05/17 1421  Fetal Heart Rate A  Mode  --  External  Baseline Rate (A)  --  130 bpm  Variability  --  <5 BPM;6-25 BPM  Accelerations  --  10 x 10  Decelerations  --  Late  Uterine Activity  Mode  --  Toco  Contraction Frequency (min)  --  2-6  Contraction Duration (sec)  --  50-120  Contraction Quality  --  Moderate  Resting Tone Palpated  --  Relaxed  Cervical Exam  Dilation 2  --   Effacement (%) 80  --   Cervical Position Posterior  --   Cervical Consistency Medium  --   Vag. Bleeding Bloody Show  --   Station -2  --   Presentation Vertex  --   Exam by: Claudette Laws rnc  --   Membranes  Membrane Status Bulging bag of water  --   Report called to [facility] CNM re: pt background, the above assessment, Fern neg, but LOF with bloody show after SVE.  CNM reviewed strip remotely while on phone. Orders rec'd to con't monitoring x1 hr, recheck cervix & call with results or before with updates.

## 2017-12-05 NOTE — MAU Note (Signed)
Pt states she has been having UCs since yesterday that have gotten worse since an hr walk approx 1hr ago. Says UCs radiate to back now. Pt rates UCs as 7 on 0-10 pain scale.  Report pink d/c today.  SVE today at [facility] was 2cm.  Denies LOF.  Report +FM.

## 2017-12-06 LAB — CBC
HEMATOCRIT: 26.2 % — AB (ref 36.0–46.0)
Hemoglobin: 8.5 g/dL — ABNORMAL LOW (ref 12.0–15.0)
MCH: 26.5 pg (ref 26.0–34.0)
MCHC: 32.4 g/dL (ref 30.0–36.0)
MCV: 81.6 fL (ref 78.0–100.0)
Platelets: 146 10*3/uL — ABNORMAL LOW (ref 150–400)
RBC: 3.21 MIL/uL — ABNORMAL LOW (ref 3.87–5.11)
RDW: 14.5 % (ref 11.5–15.5)
WBC: 13.4 10*3/uL — AB (ref 4.0–10.5)

## 2017-12-06 LAB — RPR: RPR Ser Ql: NONREACTIVE

## 2017-12-07 MED ORDER — IBUPROFEN 600 MG PO TABS: 600.0000 mg | ORAL_TABLET | Freq: Four times a day (QID) | ORAL | 0 refills | Status: DC

## 2017-12-07 NOTE — Discharge Summary (Signed)
OB Discharge Summary     Patient Name: Patricia Craig DOB: Jan 13, 1996 MRN: [id number]  Date of admission: 12/05/2017 Delivering MD: Essie Hart   Date of discharge: 12/07/2017  Admitting diagnosis: 38WKS, CTX Intrauterine pregnancy: [redacted]w[redacted]d     Secondary diagnosis:  Active Problems:   * No active hospital problems. *  Additional problems: none     Discharge diagnosis: Term Pregnancy Delivered                                                                                                Post partum procedures:  Augmentation:   Complications: None  Hospital course:  Onset of Labor With Vaginal Delivery     22 y.o. yo G1P1001 at [redacted]w[redacted]d was admitted in Latent Labor on 12/05/2017. Patient had an uncomplicated labor course as follows:  Membrane Rupture Time/Date: 6:24 PM ,12/05/2017   Intrapartum Procedures: Episiotomy: None [1]                                         Lacerations:     Patient had a delivery of a Viable infant. 12/05/2017  Information for the patient's newborn:  Beckey, Polkowski [[id number]]  Delivery Method: Vaginal, Spontaneous(Filed from Delivery Summary)    Pateint had an uncomplicated postpartum course.  She is ambulating, tolerating a regular diet, passing flatus, and urinating well. Patient is discharged home in stable condition on 12/07/17.   Physical exam  Vitals:   12/05/17 2256 12/06/17 0156 12/06/17 1816 12/07/17 0607  BP: 134/83 130/84 121/83 113/80  Pulse: (!) 57 (!) 56 63 62  Resp: Temp: 98.2 F (36.8 C) 98.3 F (36.8 C) 98.1 F (36.7 C) 99.3 F (37.4 C)  TempSrc: Oral Oral Oral Oral  SpO2: 100% 100%    Weight:      Height:       General: alert, cooperative and no distress Lochia: appropriate Uterine Fundus: firm Incision: N/A DVT Evaluation: No evidence of DVT seen on physical exam. Labs: Lab Results  Component Value Date   WBC 13.4 (H) 12/06/2017   HGB 8.5 (L) 12/06/2017   HCT 26.2 (L) 12/06/2017   MCV 81.6  12/06/2017   PLT 146 (L) 12/06/2017   CMP Latest Ref Rng & Units 12/04/2017  Glucose 65 - 99 mg/dL 425(Z)  BUN 6 - 20 mg/dL 13  Creatinine 5.63 - 8.75 mg/dL 6.43  Sodium 329 - 518 mmol/L 135  Potassium 3.5 - 5.1 mmol/L 3.6  Chloride 101 - 111 mmol/L 103  CO2 22 - 32 mmol/L 21(L)  Calcium 8.9 - 10.3 mg/dL 8.4(Z)  Total Protein 6.5 - 8.1 g/dL 6.1(L)  Total Bilirubin 0.3 - 1.2 mg/dL 0.7  Alkaline Phos 38 - 126 U/L 85  AST 15 - 41 U/L 20  ALT 14 - 54 U/L 7(L)    Discharge instruction: per After Visit Summary and "Baby and Me Booklet".  After visit meds:    Diet: routine diet  Activity: Advance as tolerated.  Pelvic rest for 6 weeks.   Outpatient follow up:6 weeks Follow up Appt:No future appointments. Follow up Visit:No follow-ups on file.  Postpartum contraception: Undecided  Newborn Data: Live born female  Birth Weight: 6 lb 0.3 oz (2730 g) APGAR: 8, 9  Newborn Delivery   Birth date/time:  12/05/2017 20:02:00 Delivery type:  Vaginal, Spontaneous     Baby Feeding: Bottle Disposition:home with mother OP Circumcision  12/07/2017 Rhea Pink, CNM

## 2017-12-07 NOTE — Discharge Instructions (Signed)

## 2018-01-15 DIAGNOSIS — D649 Anemia, unspecified: Secondary | ICD-10-CM | POA: Insufficient documentation

## 2019-12-29 ENCOUNTER — Other Ambulatory Visit: Payer: Self-pay

## 2019-12-29 ENCOUNTER — Emergency Department (HOSPITAL_BASED_OUTPATIENT_CLINIC_OR_DEPARTMENT_OTHER)
Admission: EM | Admit: 2019-12-29 | Discharge: 2019-12-29 | Disposition: A | Discharge status: Home / Self Care | Payer: BLUE CROSS/BLUE SHIELD | Attending: Emergency Medicine | Admitting: Emergency Medicine

## 2019-12-29 ENCOUNTER — Encounter (HOSPITAL_BASED_OUTPATIENT_CLINIC_OR_DEPARTMENT_OTHER): Payer: Self-pay | Admitting: *Deleted

## 2019-12-29 DIAGNOSIS — Z79899 Other long term (current) drug therapy: Secondary | ICD-10-CM | POA: Insufficient documentation

## 2019-12-29 DIAGNOSIS — R197 Diarrhea, unspecified: Secondary | ICD-10-CM | POA: Insufficient documentation

## 2019-12-29 DIAGNOSIS — R1084 Generalized abdominal pain: Secondary | ICD-10-CM | POA: Insufficient documentation

## 2019-12-29 DIAGNOSIS — R109 Unspecified abdominal pain: Secondary | ICD-10-CM

## 2019-12-29 DIAGNOSIS — Z791 Long term (current) use of non-steroidal anti-inflammatories (NSAID): Secondary | ICD-10-CM | POA: Insufficient documentation

## 2019-12-29 LAB — PREGNANCY, URINE: Preg Test, Ur: NEGATIVE

## 2019-12-29 LAB — URINALYSIS, MICROSCOPIC (REFLEX)

## 2019-12-29 LAB — URINALYSIS, ROUTINE W REFLEX MICROSCOPIC
Bilirubin Urine: NEGATIVE
Glucose, UA: NEGATIVE mg/dL
Hgb urine dipstick: NEGATIVE
Ketones, ur: 15 mg/dL — AB
Nitrite: NEGATIVE
Protein, ur: NEGATIVE mg/dL
Specific Gravity, Urine: >1.03 — ABNORMAL HIGH (ref 1.005–1.030)
pH: 5 (ref 5.0–8.0)

## 2019-12-29 LAB — CBC WITH DIFFERENTIAL/PLATELET
Abs Immature Granulocytes: 0.02 10*3/uL (ref 0.00–0.07)
Basophils Absolute: 0 10*3/uL (ref 0.0–0.1)
Basophils Relative: 0 %
Eosinophils Absolute: 0 10*3/uL (ref 0.0–0.5)
Eosinophils Relative: 0 %
HCT: 38.5 % (ref 36.0–46.0)
Hemoglobin: 12.1 g/dL (ref 12.0–15.0)
Immature Granulocytes: 0 %
Lymphocytes Relative: 13 %
Lymphs Abs: 0.6 10*3/uL — ABNORMAL LOW (ref 0.7–4.0)
MCH: 25.1 pg — ABNORMAL LOW (ref 26.0–34.0)
MCHC: 31.4 g/dL (ref 30.0–36.0)
MCV: 79.7 fL — ABNORMAL LOW (ref 80.0–100.0)
Monocytes Absolute: 0.3 10*3/uL (ref 0.1–1.0)
Monocytes Relative: 7 %
Neutro Abs: 3.9 10*3/uL (ref 1.7–7.7)
Neutrophils Relative %: 80 %
Platelets: 168 10*3/uL (ref 150–400)
RBC: 4.83 MIL/uL (ref 3.87–5.11)
RDW: 16.5 % — ABNORMAL HIGH (ref 11.5–15.5)
WBC: 4.8 10*3/uL (ref 4.0–10.5)
nRBC: 0 % (ref 0.0–0.2)

## 2019-12-29 LAB — COMPREHENSIVE METABOLIC PANEL
ALT: 9 U/L (ref 0–44)
AST: 19 U/L (ref 15–41)
Albumin: 4.5 g/dL (ref 3.5–5.0)
Alkaline Phosphatase: 38 U/L (ref 38–126)
Anion gap: 8 (ref 5–15)
BUN: 11 mg/dL (ref 6–20)
CO2: 25 mmol/L (ref 22–32)
Calcium: 9.9 mg/dL (ref 8.9–10.3)
Chloride: 102 mmol/L (ref 98–111)
Creatinine, Ser: 0.75 mg/dL (ref 0.44–1.00)
GFR calc Af Amer: >60 mL/min (ref >60)
GFR calc non Af Amer: >60 mL/min (ref >60)
Glucose, Bld: 86 mg/dL (ref 70–99)
Potassium: 3.5 mmol/L (ref 3.5–5.1)
Sodium: 135 mmol/L (ref 135–145)
Total Bilirubin: 2.7 mg/dL — ABNORMAL HIGH (ref 0.3–1.2)
Total Protein: 7.5 g/dL (ref 6.5–8.1)

## 2019-12-29 LAB — LIPASE, BLOOD: Lipase: 20 U/L (ref 11–51)

## 2019-12-29 MED ORDER — DICYCLOMINE HCL 20 MG PO TABS
20.0000 mg | ORAL_TABLET | Freq: Two times a day (BID) | ORAL | 0 refills | Status: DC
Start: 2019-12-29 — End: 2021-07-05

## 2019-12-29 MED ORDER — FAMOTIDINE IN NACL 20-0.9 MG/50ML-% IV SOLN
20.0000 mg | Freq: Once | INTRAVENOUS | Status: AC
  Administered 2019-12-29: 20 mg via INTRAVENOUS
  Filled 2019-12-29: qty 50

## 2019-12-29 MED ORDER — FAMOTIDINE 20 MG PO TABS
20.0000 mg | ORAL_TABLET | Freq: Two times a day (BID) | ORAL | 0 refills | Status: DC
Start: 2019-12-29 — End: 2021-07-12

## 2019-12-29 MED ORDER — ONDANSETRON HCL 4 MG/2ML IJ SOLN
4.0000 mg | Freq: Once | INTRAMUSCULAR | Status: AC
  Administered 2019-12-29: 4 mg via INTRAVENOUS
  Filled 2019-12-29: qty 2

## 2019-12-29 MED ORDER — DICYCLOMINE HCL 10 MG PO CAPS
20.0000 mg | ORAL_CAPSULE | Freq: Once | ORAL | Status: AC
  Administered 2019-12-29: 20 mg via ORAL
  Filled 2019-12-29: qty 2

## 2019-12-29 MED ORDER — ONDANSETRON 4 MG PO TBDP
ORAL_TABLET | ORAL | 0 refills | Status: DC
Start: 2019-12-29 — End: 2021-06-30

## 2019-12-29 MED ORDER — SODIUM CHLORIDE 0.9 % IV BOLUS
1000.0000 mL | Freq: Once | INTRAVENOUS | Status: AC
  Administered 2019-12-29: 1000 mL via INTRAVENOUS

## 2019-12-29 MED ORDER — ACETAMINOPHEN 500 MG PO TABS
1000.0000 mg | ORAL_TABLET | Freq: Once | ORAL | Status: AC
  Administered 2019-12-29: 1000 mg via ORAL
  Filled 2019-12-29: qty 2

## 2019-12-29 NOTE — Discharge Instructions (Signed)
I suspect your symptoms are related to a viral GI bug or foodborne illness. Take prescribed medications to help manage symptoms, Bentyl sure cramping, Pepcid to help with stomach irritation, and Zofran for nausea and vomiting. Eat a bland diet for the next few days and advance back to her normal diet as tolerated. Make sure you are drinking plenty of fluids. These GI bugs are usually fairly short-lived and last only a few days. If you develop fevers, worsening pain, persistent vomiting despite medicines, return to the ED.

## 2019-12-29 NOTE — ED Provider Notes (Signed)
[facility] EMERGENCY DEPARTMENT Provider Note   CSN: [id number] Arrival date & time: 12/29/19  1124     History Chief Complaint  Patient presents with  . Abdominal Pain    Patricia Craig is a 24 y.o. female.  Patricia Craig is a 24 y.o. female with a history of anxiety, who presents to the ED for evaluation abdominal pain, nausea and diarrhea.  She reports last night she started feeling unwell had some nausea but no vomiting and developed a generalized cramping abdominal pain.  She reports this morning she woke up and has had multiple episodes of nonbloody diarrhea with continued and worsening abdominal cramping.  Nausea seems to be improved and she is not having any vomiting but has poor appetite.  No fevers or chills.  No associated chest pain, shortness of breath or cough, no dysuria, urinary frequency, vaginal bleeding or vaginal discharge.  She did eat dinner at cookout last night, and her son had to leave daycare today because he began having diarrhea as well.  She has not taken any medicine prior to arrival for symptoms.  No other aggravating or alleviating factors.        Past Medical History:  Diagnosis Date  . Anxiety   . Herpes simplex type 2 infection   . Medical history non-contributory     Patient Active Problem List   Diagnosis Date Noted  . Genital herpes simplex 04/27/2017    Past Surgical History:  Procedure Laterality Date  . MOUTH SURGERY       OB History    Gravida  1   Para  1   Term  1   Preterm      AB      Living  1     SAB      TAB      Ectopic      Multiple  0   Live Births  1           No family history on file.  Social History   Tobacco Use  . Smoking status: Never Smoker  . Smokeless tobacco: Never Used  Substance Use Topics  . Alcohol use: No  . Drug use: No    Home Medications Prior to Admission medications   Medication Sig Start Date End Date Taking? Authorizing Provider  dicyclomine  (BENTYL) 20 MG tablet Take 1 tablet (20 mg total) by mouth 2 (two) times daily. 12/29/19   Dartha Lodge, PA-C  famotidine (PEPCID) 20 MG tablet Take 1 tablet (20 mg total) by mouth 2 (two) times daily. 12/29/19   Dartha Lodge, PA-C  ibuprofen (ADVIL,MOTRIN) 600 MG tablet Take 1 tablet (600 mg total) by mouth every 6 (six) hours. 12/07/17   Clemmons, Elmore Guise, CNM  ondansetron (ZOFRAN ODT) 4 MG disintegrating tablet 4mg  ODT q4 hours prn nausea/vomit 12/29/19   12/31/19, PA-C  Prenatal Vit-Fe Fumarate-FA (PRENATAL MULTIVITAMIN) TABS tablet Take 1 tablet by mouth daily at 12 noon.    [provider]    Allergies    Patient has no known allergies.  Review of Systems   Review of Systems  Constitutional: Negative for chills and fever.  HENT: Negative.   Respiratory: Negative for cough and shortness of breath.   Cardiovascular: Negative for chest pain.  Gastrointestinal: Positive for abdominal pain, diarrhea and nausea. Negative for blood in stool and vomiting.  Genitourinary: Negative for dysuria, frequency, vaginal bleeding and vaginal discharge.  Musculoskeletal: Negative for  arthralgias and myalgias.  Skin: Negative for color change and rash.  Neurological: Negative for headaches.  All other systems reviewed and are negative.   Physical Exam Updated Vital Signs BP 107/71 (BP Location: Left Arm)   Pulse 73   Temp 98.2 F (36.8 C) (Oral)   Resp 18   Ht 5\' 2"  (1.575 m)   Wt 55.8 kg   LMP 12/03/2019 (Approximate)   SpO2 100%   BMI 22.50 kg/m   Physical Exam Vitals and nursing note reviewed.  Constitutional:      General: She is not in acute distress.    Appearance: She is well-developed and normal weight. She is not ill-appearing or diaphoretic.     Comments: Well-appearing and in no distress  HENT:     Head: Normocephalic and atraumatic.     Mouth/Throat:     Mouth: Mucous membranes are moist.     Pharynx: Oropharynx is clear.  Eyes:     General:        Right  eye: No discharge.        Left eye: No discharge.     Pupils: Pupils are equal, round, and reactive to light.  Cardiovascular:     Rate and Rhythm: Normal rate and regular rhythm.     Heart sounds: Normal heart sounds.  Pulmonary:     Effort: Pulmonary effort is normal. No respiratory distress.     Breath sounds: Normal breath sounds. No wheezing or rales.     Comments: Respirations equal and unlabored, patient able to speak in full sentences, lungs clear to auscultation bilaterally Abdominal:     General: Bowel sounds are normal. There is no distension.     Palpations: Abdomen is soft. There is no mass.     Tenderness: There is generalized abdominal tenderness. There is no guarding.     Comments: Abdomen soft, nondistended, bowel sounds present throughout, mild generalized tenderness throughout the abdomen that does not localize to 1 quadrant, no guarding or peritoneal signs  Musculoskeletal:        General: No deformity.     Cervical back: Neck supple.  Skin:    General: Skin is warm and dry.     Capillary Refill: Capillary refill takes less than 2 seconds.  Neurological:     Mental Status: She is alert.     Coordination: Coordination normal.     Comments: Speech is clear, able to follow commands Moves extremities without ataxia, coordination intact  Psychiatric:        Mood and Affect: Mood normal.        Behavior: Behavior normal.     ED Results / Procedures / Treatments   Labs (all labs ordered are listed, but only abnormal results are displayed) Labs Reviewed  COMPREHENSIVE METABOLIC PANEL - Abnormal; Notable for the following components:      Result Value   Total Bilirubin 2.7 (*)    All other components within normal limits  CBC WITH DIFFERENTIAL/PLATELET - Abnormal; Notable for the following components:   MCV 79.7 (*)    MCH 25.1 (*)    RDW 16.5 (*)    Lymphs Abs 0.6 (*)    All other components within normal limits  URINALYSIS, ROUTINE W REFLEX MICROSCOPIC -  Abnormal; Notable for the following components:   APPearance HAZY (*)    Specific Gravity, Urine >1.030 (*)    Ketones, ur 15 (*)    Leukocytes,Ua SMALL (*)    All other components within normal limits  URINALYSIS, MICROSCOPIC (REFLEX) - Abnormal; Notable for the following components:   Bacteria, UA MANY (*)    All other components within normal limits  URINE CULTURE  PREGNANCY, URINE  LIPASE, BLOOD    EKG None  Radiology No results found.  Procedures Procedures (including critical care time)  Medications Ordered in ED Medications  ondansetron (ZOFRAN) injection 4 mg (4 mg Intravenous Given 12/29/19 1253)  sodium chloride 0.9 % bolus 1,000 mL (0 mLs Intravenous Stopped 12/29/19 1358)  famotidine (PEPCID) IVPB 20 mg premix (0 mg Intravenous Stopped 12/29/19 1327)  dicyclomine (BENTYL) capsule 20 mg (20 mg Oral Given 12/29/19 1257)  acetaminophen (TYLENOL) tablet 1,000 mg (1,000 mg Oral Given 12/29/19 1527)    ED Course  I have reviewed the triage vital signs and the nursing notes.  Pertinent labs & imaging results that were available during my care of the patient were reviewed by me and considered in my medical decision making (see chart for details).    MDM Rules/Calculators/A&P                          Patient presents to the ED with complaints of abdominal pain. Patient nontoxic appearing, in no apparent distress, vitals WNL . On exam patient with mild abdominal tenderness throughout but does not localize to 1 area, no peritoneal signs. Will evaluate with labs, do not think imaging is indicated.  Patient's son developed diarrhea as well I suspect this may be viral GI illness.  Analgesics, anti-emetics, and fluids administered.   ER work-up reviewed:  CBC: No leukocytosis, normal hemoglobin CMP: No significant electrolyte derangements, normal renal function, T bili of 2.7, but no other abnormal LFTs, no focal right upper quadrant tenderness. Lipase: WNL UA: Many bacteria  present but there are some squamous epithelial cells present only a few WBCs, patient is not having urinary symptoms, will send for culture but will not treat for UTI at this time Preg test: Negative  On repeat abdominal exam patient remains without peritoneal signs, doubt cholecystitis, pancreatitis, diverticulitis, appendicitis, bowel obstruction/perforation,  PID, or ectopic pregnancy. Patient tolerating PO in the emergency department. Will discharge home with supportive measures. I discussed results, treatment plan, need for PCP follow-up, and return precautions with the patient. Provided opportunity for questions, patient confirmed understanding and is in agreement with plan.    Final Clinical Impression(s) / ED Diagnoses Final diagnoses:  Abdominal cramping  Diarrhea, unspecified type    Rx / DC Orders ED Discharge Orders         Ordered    dicyclomine (BENTYL) 20 MG tablet  2 times daily     Discontinue  Reprint     12/29/19 1601    famotidine (PEPCID) 20 MG tablet  2 times daily     Discontinue  Reprint     12/29/19 1601    ondansetron (ZOFRAN ODT) 4 MG disintegrating tablet     Discontinue  Reprint     12/29/19 1601           Dartha Lodge, PA-C 12/29/19 1714    Raeford Razor, MD 12/30/19 [phone]

## 2019-12-29 NOTE — ED Triage Notes (Addendum)
abd pain x 2 days  w diarrhea,  Denies n/v  Denies vag dc

## 2019-12-30 LAB — URINE CULTURE: Culture: <10000 — AB

## 2021-05-27 ENCOUNTER — Emergency Department (HOSPITAL_BASED_OUTPATIENT_CLINIC_OR_DEPARTMENT_OTHER)
Admission: EM | Admit: 2021-05-27 | Discharge: 2021-05-27 | Disposition: A | Discharge status: Home / Self Care | Payer: Medicaid Other | Attending: Emergency Medicine | Admitting: Emergency Medicine

## 2021-05-27 ENCOUNTER — Encounter (HOSPITAL_BASED_OUTPATIENT_CLINIC_OR_DEPARTMENT_OTHER): Payer: Self-pay | Admitting: Urology

## 2021-05-27 ENCOUNTER — Other Ambulatory Visit: Payer: Self-pay

## 2021-05-27 DIAGNOSIS — O26891 Other specified pregnancy related conditions, first trimester: Secondary | ICD-10-CM | POA: Insufficient documentation

## 2021-05-27 DIAGNOSIS — R112 Nausea with vomiting, unspecified: Secondary | ICD-10-CM | POA: Insufficient documentation

## 2021-05-27 DIAGNOSIS — Z3A01 Less than 8 weeks gestation of pregnancy: Secondary | ICD-10-CM | POA: Diagnosis not present

## 2021-05-27 DIAGNOSIS — R1013 Epigastric pain: Secondary | ICD-10-CM

## 2021-05-27 LAB — URINALYSIS, ROUTINE W REFLEX MICROSCOPIC
Bilirubin Urine: NEGATIVE
Glucose, UA: NEGATIVE mg/dL
Hgb urine dipstick: NEGATIVE
Ketones, ur: NEGATIVE mg/dL
Leukocytes,Ua: NEGATIVE
Nitrite: NEGATIVE
Protein, ur: NEGATIVE mg/dL
Specific Gravity, Urine: >1.03 — ABNORMAL HIGH (ref 1.005–1.030)
pH: 5.5 (ref 5.0–8.0)

## 2021-05-27 LAB — PREGNANCY, URINE: Preg Test, Ur: POSITIVE — AB

## 2021-05-27 MED ORDER — FAMOTIDINE 20 MG PO TABS
20.0000 mg | ORAL_TABLET | Freq: Once | ORAL | Status: AC
  Administered 2021-05-27: 20 mg via ORAL
  Filled 2021-05-27: qty 1

## 2021-05-27 NOTE — ED Triage Notes (Signed)
Mid abdominal pain x 3 days, reports n/v yesterday but none today, normal BM

## 2021-05-27 NOTE — ED Provider Notes (Signed)
[facility] EMERGENCY DEPARTMENT Provider Note   CSN: [id number] Arrival date & time: 05/27/21  1753     History Chief Complaint  Patient presents with   Abdominal Pain    Patricia Craig is a 25 y.o. female with no significant past medical history who presents with 3 days of mid abdominal pain.  Patient did have some nausea and vomiting yesterday.  Patient does report that she is sexually active, does not take birth control.  Patient denies any vaginal bleeding, vaginal discharge, dysuria, despair unit.  Patient reports last menstrual period was 10/3, however she took a home pregnancy test which was negative.  Patient denies any dietary changes.  Patient reports that she has had normal bowel movement, and has been passing gas without issue.  No history of abdominal surgery, no history of gallstones.   Abdominal Pain Associated symptoms: nausea and vomiting       Past Medical History:  Diagnosis Date   Anxiety    Herpes simplex type 2 infection    Medical history non-contributory     Patient Active Problem List   Diagnosis Date Noted   Genital herpes simplex 04/27/2017    Past Surgical History:  Procedure Laterality Date   MOUTH SURGERY       OB History     Gravida  1   Para  1   Term  1   Preterm      AB      Living  1      SAB      IAB      Ectopic      Multiple  0   Live Births  1           History reviewed. No pertinent family history.  Social History   Tobacco Use   Smoking status: Never   Smokeless tobacco: Never  Substance Use Topics   Alcohol use: No   Drug use: No    Home Medications Prior to Admission medications   Medication Sig Start Date End Date Taking? Authorizing Provider  dicyclomine (BENTYL) 20 MG tablet Take 1 tablet (20 mg total) by mouth 2 (two) times daily. 12/29/19   Dartha Lodge, PA-C  famotidine (PEPCID) 20 MG tablet Take 1 tablet (20 mg total) by mouth 2 (two) times daily. 12/29/19   Dartha Lodge, PA-C  ibuprofen (ADVIL,MOTRIN) 600 MG tablet Take 1 tablet (600 mg total) by mouth every 6 (six) hours. 12/07/17   Clemmons, Elmore Guise, CNM  ondansetron (ZOFRAN ODT) 4 MG disintegrating tablet 4mg  ODT q4 hours prn nausea/vomit 12/29/19   12/31/19, PA-C  Prenatal Vit-Fe Fumarate-FA (PRENATAL MULTIVITAMIN) TABS tablet Take 1 tablet by mouth daily at 12 noon.    [provider]    Allergies    Patient has no known allergies.  Review of Systems   Review of Systems  Gastrointestinal:  Positive for abdominal pain, nausea and vomiting.  All other systems reviewed and are negative.  Physical Exam Updated Vital Signs BP 114/70 (BP Location: Left Arm)   Pulse 62   Temp 98.4 F (36.9 C) (Oral)   Resp 18   Ht 5\' 2"  (1.575 m)   Wt 55.8 kg   LMP 04/18/2021 (Exact Date)   SpO2 99%   BMI 22.50 kg/m   Physical Exam Vitals and nursing note reviewed.  Constitutional:      General: She is not in acute distress.    Appearance: Normal appearance.  HENT:     Head: Normocephalic and atraumatic.  Eyes:     General:        Right eye: No discharge.        Left eye: No discharge.  Cardiovascular:     Rate and Rhythm: Normal rate and regular rhythm.     Heart sounds: No murmur heard.   No friction rub. No gallop.  Pulmonary:     Effort: Pulmonary effort is normal.     Breath sounds: Normal breath sounds.  Abdominal:     General: Bowel sounds are normal.     Palpations: Abdomen is soft.     Comments: Some tenderness to palpation in epigastric region without any mass palpated.  Negative Murphy sign, no rebound rigidity or guarding throughout.  No lower quadrant tenderness whatsoever, no suprapubic tenderness.  No CVA tenderness.  Normal bowel sounds throughout.  Abdomen is nongravid.  Skin:    General: Skin is warm and dry.     Capillary Refill: Capillary refill takes less than 2 seconds.  Neurological:     Mental Status: She is alert and oriented to person, place, and  time.  Psychiatric:        Mood and Affect: Mood normal.        Behavior: Behavior normal.    ED Results / Procedures / Treatments   Labs (all labs ordered are listed, but only abnormal results are displayed) Labs Reviewed  URINALYSIS, ROUTINE W REFLEX MICROSCOPIC - Abnormal; Notable for the following components:      Result Value   APPearance HAZY (*)    Specific Gravity, Urine >1.030 (*)    All other components within normal limits  PREGNANCY, URINE - Abnormal; Notable for the following components:   Preg Test, Ur POSITIVE (*)    All other components within normal limits    EKG None  Radiology No results found.  Procedures Procedures   Medications Ordered in ED Medications  famotidine (PEPCID) tablet 20 mg (20 mg Oral Given 05/27/21 1846)    ED Course  I have reviewed the triage vital signs and the nursing notes.  Pertinent labs & imaging results that were available during my care of the patient were reviewed by me and considered in my medical decision making (see chart for details).    MDM Rules/Calculators/A&P                         I discussed this case with my attending physician who cosigned this note including patient's presenting symptoms, physical exam, and planned diagnostics and interventions. Attending physician stated agreement with plan or made changes to plan which were implemented.   Overall well-appearing 25 year old with no significant abdominal past medical history presents with 3 days of epigastric pain.  Patient denies any gastric reflux symptoms.  Patient is sexually active, is late for her period at this time.  Physical exam reveals generalized epigastric tenderness without mass, no lower quadrant tenderness whatsoever.  Patient denies vaginal bleeding, vaginal discharge, dyspareunia.  Initial lab work-up does reveal positive pregnancy test, urinalysis without abnormality.  Minimal clinical concern for ectopic pregnancy at this time given location  of abdominal pain, stable vital signs, no vaginal bleeding.  Discussed with patient believe that some of the abdominal pain, nausea, vomiting may be related to pregnancy but could also be unrelated, discussed differential including cholecystitis, pancreatitis, gastroenteritis, irritable bowel, acid reflux with patient.  Discussed I would recommend basic lab work, including CBC,  CMP and lipase.  Patient at this time declines additional lab work, reporting that she does believe that it may be related to the pregnancy.  She does request Pepcid for the stomach pain, which we will administer at this time.  Encourage patient to follow-up with OB/GYN, discussed strict return precautions, with possibility that she may still have an emergent abdominal condition that is not evaluated for and is not explained by her pregnancy at this time.  Patient understands and agrees to plan.  Discharged in stable condition. Final Clinical Impression(s) / ED Diagnoses Final diagnoses:  Epigastric pain  Less than [redacted] weeks gestation of pregnancy    Rx / DC Orders ED Discharge Orders     None        Olene Floss, PA-C 05/27/21 1911    Melene Plan, DO 05/27/21 1922

## 2021-05-27 NOTE — ED Notes (Signed)
Pt states LMP 04/18/2021, no currently on birthcontrol

## 2021-05-27 NOTE — Discharge Instructions (Addendum)
As we discussed, it is possible your stomach pain is related to the pregnancy but it is always possible that something else could be causing the pain. I do recommend you follow up if the stomach pain worsens or fails to improve. Please get further evaluation with an OBGYN at your earliest convenience.

## 2021-05-27 NOTE — ED Notes (Signed)
Patient discharged to home.  All discharge instructions reviewed.  Patient verbalized understanding via teachback method.  VS WDL.  Respirations even and unlabored.  Ambulatory out of ED.   °

## 2021-05-31 ENCOUNTER — Other Ambulatory Visit: Payer: Self-pay

## 2021-05-31 ENCOUNTER — Inpatient Hospital Stay (HOSPITAL_COMMUNITY)
Admission: AD | Admit: 2021-05-31 | Discharge: 2021-05-31 | Disposition: A | Discharge status: Home / Self Care | Payer: Medicaid Other | Attending: Obstetrics & Gynecology | Admitting: Obstetrics & Gynecology

## 2021-05-31 ENCOUNTER — Encounter (HOSPITAL_COMMUNITY): Payer: Self-pay | Admitting: *Deleted

## 2021-05-31 DIAGNOSIS — Z3481 Encounter for supervision of other normal pregnancy, first trimester: Secondary | ICD-10-CM | POA: Insufficient documentation

## 2021-05-31 DIAGNOSIS — Z3480 Encounter for supervision of other normal pregnancy, unspecified trimester: Secondary | ICD-10-CM

## 2021-05-31 NOTE — MAU Note (Signed)
Presents stating had a +HPT and wants to know "how far along" she is.  LMP 04/18/2021. Denies pain or cramping.

## 2021-05-31 NOTE — MAU Provider Note (Signed)
[name] is a 25 y.o. G2P1001  At [redacted]w[redacted]d here with concern for dating. She denies any pain, bleeding or other concerns. She plans to get her care at [facility]. Since patient has Medicaid, I recommended that she contact [facility] and see if she can start prenatal care with them. If not, she can call [facility] for appt. Patient verbalized understanding.       Charlesetta Garibaldi Maia Handa 05/31/2021, 10:27 AM

## 2021-05-31 NOTE — Discharge Instructions (Signed)
Prenatal Care Providers           [facility]  [address] [phone]  [facility] @ [facility]   [address]  [phone]  [facility] @ [place]       [address] [phone]            [facility] @ [place]     [address] [phone]          [facility] [place]   [address] [phone]  [facility]  [address] [phone]     [facility] @ [facility])  [address]   [phone]     [facility]  Phone: [phone]   

## 2021-06-04 ENCOUNTER — Emergency Department (HOSPITAL_BASED_OUTPATIENT_CLINIC_OR_DEPARTMENT_OTHER)
Admission: EM | Admit: 2021-06-04 | Discharge: 2021-06-04 | Disposition: A | Discharge status: Home / Self Care | Payer: Medicaid Other | Attending: Emergency Medicine | Admitting: Emergency Medicine

## 2021-06-04 ENCOUNTER — Encounter (HOSPITAL_BASED_OUTPATIENT_CLINIC_OR_DEPARTMENT_OTHER): Payer: Self-pay | Admitting: Emergency Medicine

## 2021-06-04 DIAGNOSIS — Z3A01 Less than 8 weeks gestation of pregnancy: Secondary | ICD-10-CM | POA: Diagnosis not present

## 2021-06-04 DIAGNOSIS — O219 Vomiting of pregnancy, unspecified: Secondary | ICD-10-CM | POA: Diagnosis present

## 2021-06-04 DIAGNOSIS — O210 Mild hyperemesis gravidarum: Secondary | ICD-10-CM

## 2021-06-04 LAB — COMPREHENSIVE METABOLIC PANEL
ALT: 10 U/L (ref 0–44)
AST: 16 U/L (ref 15–41)
Albumin: 4.2 g/dL (ref 3.5–5.0)
Alkaline Phosphatase: 32 U/L — ABNORMAL LOW (ref 38–126)
Anion gap: 11 (ref 5–15)
BUN: 14 mg/dL (ref 6–20)
CO2: 23 mmol/L (ref 22–32)
Calcium: 9.3 mg/dL (ref 8.9–10.3)
Chloride: 101 mmol/L (ref 98–111)
Creatinine, Ser: 0.64 mg/dL (ref 0.44–1.00)
GFR, Estimated: >60 mL/min (ref >60)
Glucose, Bld: 80 mg/dL (ref 70–99)
Potassium: 3.7 mmol/L (ref 3.5–5.1)
Sodium: 135 mmol/L (ref 135–145)
Total Bilirubin: 1.6 mg/dL — ABNORMAL HIGH (ref 0.3–1.2)
Total Protein: 7.6 g/dL (ref 6.5–8.1)

## 2021-06-04 LAB — CBC WITH DIFFERENTIAL/PLATELET
Abs Immature Granulocytes: 0.02 10*3/uL (ref 0.00–0.07)
Basophils Absolute: 0.1 10*3/uL (ref 0.0–0.1)
Basophils Relative: 1 %
Eosinophils Absolute: 0 10*3/uL (ref 0.0–0.5)
Eosinophils Relative: 0 %
HCT: 35.1 % — ABNORMAL LOW (ref 36.0–46.0)
Hemoglobin: 11.5 g/dL — ABNORMAL LOW (ref 12.0–15.0)
Immature Granulocytes: 0 %
Lymphocytes Relative: 13 %
Lymphs Abs: 0.9 10*3/uL (ref 0.7–4.0)
MCH: 25.9 pg — ABNORMAL LOW (ref 26.0–34.0)
MCHC: 32.8 g/dL (ref 30.0–36.0)
MCV: 79.1 fL — ABNORMAL LOW (ref 80.0–100.0)
Monocytes Absolute: 0.5 10*3/uL (ref 0.1–1.0)
Monocytes Relative: 7 %
Neutro Abs: 5.6 10*3/uL (ref 1.7–7.7)
Neutrophils Relative %: 79 %
Platelets: 202 10*3/uL (ref 150–400)
RBC: 4.44 MIL/uL (ref 3.87–5.11)
RDW: 17 % — ABNORMAL HIGH (ref 11.5–15.5)
WBC: 7.1 10*3/uL (ref 4.0–10.5)
nRBC: 0 % (ref 0.0–0.2)

## 2021-06-04 MED ORDER — METOCLOPRAMIDE HCL 5 MG/ML IJ SOLN
10.0000 mg | Freq: Once | INTRAMUSCULAR | Status: AC
  Administered 2021-06-04: 10 mg via INTRAVENOUS
  Filled 2021-06-04: qty 2

## 2021-06-04 MED ORDER — ONDANSETRON 4 MG PO TBDP
4.0000 mg | ORAL_TABLET | Freq: Once | ORAL | Status: AC
  Administered 2021-06-04: 4 mg via ORAL
  Filled 2021-06-04: qty 1

## 2021-06-04 MED ORDER — LACTATED RINGERS IV BOLUS
1000.0000 mL | Freq: Once | INTRAVENOUS | Status: AC
  Administered 2021-06-04: 1000 mL via INTRAVENOUS

## 2021-06-04 MED ORDER — DOXYLAMINE-PYRIDOXINE 10-10 MG PO TBEC
DELAYED_RELEASE_TABLET | ORAL | 0 refills | Status: DC
Start: 2021-06-04 — End: 2021-06-30

## 2021-06-04 NOTE — ED Triage Notes (Signed)
Pt reports NV; approx [redacted] wks pregnant

## 2021-06-04 NOTE — ED Provider Notes (Signed)
[facility] EMERGENCY DEPARTMENT Provider Note   CSN: [id number] Arrival date & time: 06/04/21  1206     History Chief Complaint  Patient presents with   Emesis During Pregnancy    Patricia Craig is a 25 y.o. female who estimates she is approximately [redacted] weeks pregnant.  She is a G2, P1.  LMP April 18, 2021.  Patient states that she had some minimal nausea and vomiting with her previous pregnancy however she has been persistently very nauseous and having several episodes of vomiting every single day.  She feels extremely fatigued and weak and this is what brought her in today.  Patient is waiting for her Medicaid to kick in and has a prenatal visit set up for 11 weeks.  She has been taking prenatal vitamins.  She denies fevers, chills, abdominal pain, urinary symptoms, vaginal bleeding.  HPI     Past Medical History:  Diagnosis Date   Anxiety    Herpes simplex type 2 infection    Medical history non-contributory     Patient Active Problem List   Diagnosis Date Noted   Genital herpes simplex 04/27/2017    Past Surgical History:  Procedure Laterality Date   MOUTH SURGERY       OB History     Gravida  2   Para  1   Term  1   Preterm      AB      Living  1      SAB      IAB      Ectopic      Multiple  0   Live Births  1           No family history on file.  Social History   Tobacco Use   Smoking status: Never   Smokeless tobacco: Never  Substance Use Topics   Alcohol use: No   Drug use: No    Home Medications Prior to Admission medications   Medication Sig Start Date End Date Taking? Authorizing Provider  dicyclomine (BENTYL) 20 MG tablet Take 1 tablet (20 mg total) by mouth 2 (two) times daily. 12/29/19   Dartha Lodge, PA-C  famotidine (PEPCID) 20 MG tablet Take 1 tablet (20 mg total) by mouth 2 (two) times daily. 12/29/19   Dartha Lodge, PA-C  ibuprofen (ADVIL,MOTRIN) 600 MG tablet Take 1 tablet (600 mg total) by mouth  every 6 (six) hours. 12/07/17   Clemmons, Elmore Guise, CNM  ondansetron (ZOFRAN ODT) 4 MG disintegrating tablet 4mg  ODT q4 hours prn nausea/vomit 12/29/19   12/31/19, PA-C  Prenatal Vit-Fe Fumarate-FA (PRENATAL MULTIVITAMIN) TABS tablet Take 1 tablet by mouth daily at 12 noon.    [provider]    Allergies    Patient has no known allergies.  Review of Systems   Review of Systems Ten systems reviewed and are negative for acute change, except as noted in the HPI.   Physical Exam Updated Vital Signs BP 122/85   Pulse 82   Temp 97.8 F (36.6 C) (Oral)   Resp 18   Ht 5\' 2"  (1.575 m)   Wt 55.3 kg   LMP 04/18/2021 (Exact Date)   SpO2 100%   BMI 22.31 kg/m   Physical Exam Vitals and nursing note reviewed.  Constitutional:      General: She is not in acute distress.    Appearance: She is well-developed. She is not diaphoretic.  HENT:     Head: Normocephalic  and atraumatic.     Right Ear: External ear normal.     Left Ear: External ear normal.     Nose: Nose normal.     Mouth/Throat:     Mouth: Mucous membranes are moist.  Eyes:     General: No scleral icterus.    Extraocular Movements: Extraocular movements intact.     Conjunctiva/sclera: Conjunctivae normal.     Pupils: Pupils are equal, round, and reactive to light.  Cardiovascular:     Rate and Rhythm: Normal rate and regular rhythm.     Heart sounds: Normal heart sounds. No murmur heard.   No friction rub. No gallop.  Pulmonary:     Effort: Pulmonary effort is normal. No respiratory distress.     Breath sounds: Normal breath sounds.  Abdominal:     General: Bowel sounds are normal. There is no distension.     Palpations: Abdomen is soft. There is no mass.     Tenderness: There is no abdominal tenderness. There is no guarding.  Musculoskeletal:     Cervical back: Normal range of motion.  Skin:    General: Skin is warm and dry.  Neurological:     Mental Status: She is alert and oriented to person, place,  and time.  Psychiatric:        Behavior: Behavior normal.    ED Results / Procedures / Treatments   Labs (all labs ordered are listed, but only abnormal results are displayed) Labs Reviewed  CBC WITH DIFFERENTIAL/PLATELET  COMPREHENSIVE METABOLIC PANEL    EKG None  Radiology No results found.  Procedures Procedures   Medications Ordered in ED Medications  lactated ringers bolus 1,000 mL (has no administration in time range)  metoCLOPramide (REGLAN) injection 10 mg (has no administration in time range)  ondansetron (ZOFRAN-ODT) disintegrating tablet 4 mg (4 mg Oral Given 06/04/21 1251)    ED Course  I have reviewed the triage vital signs and the nursing notes.  Pertinent labs & imaging results that were available during my care of the patient were reviewed by me and considered in my medical decision making (see chart for details).    MDM Rules/Calculators/A&P                         Patient here with vomiting in early pregnancy.  Vomiting is controlled.  She has no nausea and is tolerating p.o. fluids.  Patient was given Zofran and Reglan.  Will discharge with Diclegis.  Abdominal exam is benign.  Patient has no signs or symptoms of viral infection, influenza or other cause of vomiting. Final Clinical Impression(s) / ED Diagnoses Final diagnoses:  Morning sickness    Rx / DC Orders ED Discharge Orders     None        Arthor Captain, PA-C 06/04/21 1456    Alvira Monday, MD 06/05/21 [phone]

## 2021-06-04 NOTE — ED Notes (Signed)
ED Provider at bedside. 

## 2021-06-04 NOTE — Discharge Instructions (Signed)
Contact a health care provider if: You have pain in your abdomen. You have a severe headache. You have vision problems. You are losing weight. You feel weak or dizzy. You cannot eat or drink without vomiting, especially if this goes on for a full day. Get help right away if: You cannot drink fluids without vomiting. You vomit blood. You have constant nausea and vomiting. You are very weak. You faint. You have a fever and your symptoms suddenly get worse.

## 2021-06-08 ENCOUNTER — Telehealth (HOSPITAL_BASED_OUTPATIENT_CLINIC_OR_DEPARTMENT_OTHER): Payer: Self-pay | Admitting: Emergency Medicine

## 2021-06-09 ENCOUNTER — Other Ambulatory Visit: Payer: Self-pay

## 2021-06-09 ENCOUNTER — Emergency Department (HOSPITAL_BASED_OUTPATIENT_CLINIC_OR_DEPARTMENT_OTHER)
Admission: EM | Admit: 2021-06-09 | Discharge: 2021-06-09 | Disposition: A | Discharge status: Home / Self Care | Payer: Medicaid Other | Attending: Emergency Medicine | Admitting: Emergency Medicine

## 2021-06-09 ENCOUNTER — Encounter (HOSPITAL_BASED_OUTPATIENT_CLINIC_OR_DEPARTMENT_OTHER): Payer: Self-pay | Admitting: Emergency Medicine

## 2021-06-09 DIAGNOSIS — O0289 Other abnormal products of conception: Secondary | ICD-10-CM | POA: Insufficient documentation

## 2021-06-09 DIAGNOSIS — O2391 Unspecified genitourinary tract infection in pregnancy, first trimester: Secondary | ICD-10-CM | POA: Insufficient documentation

## 2021-06-09 DIAGNOSIS — O219 Vomiting of pregnancy, unspecified: Secondary | ICD-10-CM | POA: Diagnosis present

## 2021-06-09 DIAGNOSIS — R8271 Bacteriuria: Secondary | ICD-10-CM

## 2021-06-09 DIAGNOSIS — Z3A08 8 weeks gestation of pregnancy: Secondary | ICD-10-CM | POA: Insufficient documentation

## 2021-06-09 DIAGNOSIS — R1013 Epigastric pain: Secondary | ICD-10-CM | POA: Insufficient documentation

## 2021-06-09 LAB — CBC WITH DIFFERENTIAL/PLATELET
Abs Immature Granulocytes: 0.01 10*3/uL (ref 0.00–0.07)
Basophils Absolute: 0 10*3/uL (ref 0.0–0.1)
Basophils Relative: 1 %
Eosinophils Absolute: 0.1 10*3/uL (ref 0.0–0.5)
Eosinophils Relative: 1 %
HCT: 34.5 % — ABNORMAL LOW (ref 36.0–46.0)
Hemoglobin: 11.3 g/dL — ABNORMAL LOW (ref 12.0–15.0)
Immature Granulocytes: 0 %
Lymphocytes Relative: 21 %
Lymphs Abs: 1.2 10*3/uL (ref 0.7–4.0)
MCH: 26 pg (ref 26.0–34.0)
MCHC: 32.8 g/dL (ref 30.0–36.0)
MCV: 79.3 fL — ABNORMAL LOW (ref 80.0–100.0)
Monocytes Absolute: 0.6 10*3/uL (ref 0.1–1.0)
Monocytes Relative: 10 %
Neutro Abs: 3.8 10*3/uL (ref 1.7–7.7)
Neutrophils Relative %: 67 %
Platelets: 177 10*3/uL (ref 150–400)
RBC: 4.35 MIL/uL (ref 3.87–5.11)
RDW: 17 % — ABNORMAL HIGH (ref 11.5–15.5)
WBC: 5.6 10*3/uL (ref 4.0–10.5)
nRBC: 0 % (ref 0.0–0.2)

## 2021-06-09 LAB — URINALYSIS, ROUTINE W REFLEX MICROSCOPIC
Bilirubin Urine: NEGATIVE
Glucose, UA: >=500 mg/dL — AB
Hgb urine dipstick: NEGATIVE
Ketones, ur: 80 mg/dL — AB
Leukocytes,Ua: NEGATIVE
Nitrite: NEGATIVE
Protein, ur: NEGATIVE mg/dL
Specific Gravity, Urine: 1.025 (ref 1.005–1.030)
pH: 5.5 (ref 5.0–8.0)

## 2021-06-09 LAB — COMPREHENSIVE METABOLIC PANEL
ALT: 11 U/L (ref 0–44)
AST: 22 U/L (ref 15–41)
Albumin: 4.3 g/dL (ref 3.5–5.0)
Alkaline Phosphatase: 32 U/L — ABNORMAL LOW (ref 38–126)
Anion gap: 10 (ref 5–15)
BUN: 16 mg/dL (ref 6–20)
CO2: 24 mmol/L (ref 22–32)
Calcium: 9.3 mg/dL (ref 8.9–10.3)
Chloride: 101 mmol/L (ref 98–111)
Creatinine, Ser: 0.8 mg/dL (ref 0.44–1.00)
GFR, Estimated: >60 mL/min (ref >60)
Glucose, Bld: 90 mg/dL (ref 70–99)
Potassium: 3.4 mmol/L — ABNORMAL LOW (ref 3.5–5.1)
Sodium: 135 mmol/L (ref 135–145)
Total Bilirubin: 1.3 mg/dL — ABNORMAL HIGH (ref 0.3–1.2)
Total Protein: 8.1 g/dL (ref 6.5–8.1)

## 2021-06-09 LAB — URINALYSIS, MICROSCOPIC (REFLEX)

## 2021-06-09 LAB — HCG, QUANTITATIVE, PREGNANCY: hCG, Beta Chain, Quant, S: 296034 m[IU]/mL — ABNORMAL HIGH (ref <5)

## 2021-06-09 MED ORDER — ONDANSETRON HCL 4 MG PO TABS: 4.0000 mg | ORAL_TABLET | Freq: Once | ORAL | Status: DC

## 2021-06-09 MED ORDER — ONDANSETRON HCL 4 MG/2ML IJ SOLN
4.0000 mg | Freq: Once | INTRAMUSCULAR | Status: AC
  Administered 2021-06-09: 4 mg via INTRAVENOUS
  Filled 2021-06-09: qty 2

## 2021-06-09 MED ORDER — DEXTROSE 5 % AND 0.9 % NACL IV BOLUS
1000.0000 mL | Freq: Once | INTRAVENOUS | Status: AC
  Administered 2021-06-09: 1000 mL via INTRAVENOUS

## 2021-06-09 MED ORDER — CEPHALEXIN 500 MG PO CAPS
500.0000 mg | ORAL_CAPSULE | Freq: Two times a day (BID) | ORAL | 0 refills | Status: AC
Start: 2021-06-09 — End: 2021-06-16

## 2021-06-09 MED ORDER — ONDANSETRON HCL 4 MG PO TABS
4.0000 mg | ORAL_TABLET | Freq: Three times a day (TID) | ORAL | 0 refills | Status: DC | PRN
Start: 2021-06-09 — End: 2021-07-05

## 2021-06-09 MED ORDER — ONDANSETRON 4 MG PO TBDP
4.0000 mg | ORAL_TABLET | Freq: Once | ORAL | Status: AC
  Administered 2021-06-09: 4 mg via ORAL

## 2021-06-09 MED ORDER — ONDANSETRON 4 MG PO TBDP
ORAL_TABLET | ORAL | Status: AC
  Filled 2021-06-09: qty 1

## 2021-06-09 NOTE — ED Triage Notes (Signed)
Pt reports n/v for the past 2 weeks. Pt is [redacted] weeks pregnant. Could not afford prescription from past visit. Reports she is unable to keep down fluids.

## 2021-06-09 NOTE — Discharge Instructions (Addendum)
Please follow up with your PCP in the next few days for repeat assessment, also recommend repeat of your BHCG hormone levels.   Take vitamin B6 (PYRIDOXINE) 10 to 25 mg orally in combination with doxylamine (UNISOM) 10-12.5 mg orally.  I would recommend taking the doxylamine at nighttime because it can make you sleepy.  You can take the pyridoxine/vitamin B6 twice daily (morning and evening). Would recommend taking ginger throughout the day, there are ginger lozenges available and you can taken them 2-3 times daily at mealtimes.  Eat small meals throughout the day, bland diet.  Avoiding an empty stomach ? Eating small frequent meals ? eating crackers prior to getting out of bed ? Eating a high protein snack before bed ? Avoiding spicy or rich foods ? Avoiding smells that are nausea inducing

## 2021-06-09 NOTE — ED Notes (Signed)
Pt tolerated a gingerale.  

## 2021-06-09 NOTE — ED Provider Notes (Signed)
[facility] EMERGENCY DEPARTMENT Provider Note   CSN: [id number] Arrival date & time: 06/09/21  0731     History Chief Complaint  Patient presents with   Emesis    Patricia Craig is a 25 y.o. female.  This is a 25 y.o. female with significant medical history as below, including anxiety, G2P1001 who presents to the ED with complaint of n/v. She is approx [redacted] wks pregnant, was seen recently with similar complaints but unable to fill scrip for diclegis due to cost issues (>$200). She has been using acupressure wrist bands and drinking ginger ale which have not significantly helped her symptoms. Also started taking b6 last night. She has had limited oral intake over the past few days secondary to nausea, vomiting.  She is concerned She is dehydrated.  She has minimal ongoing epigastric abdominal pain described as burning/aching.  No change urination.  No change in bowel function, abdominal cramping, vaginal bleeding or rush of fluids.  No sensation of contractions.  No fevers or chills.  No chest pain or dyspnea.  No rashes.  The history is provided by the patient. No language interpreter was used.  Emesis Associated symptoms: abdominal pain   Associated symptoms: no cough, no fever and no headaches       Past Medical History:  Diagnosis Date   Anxiety    Herpes simplex type 2 infection    Medical history non-contributory     Patient Active Problem List   Diagnosis Date Noted   Genital herpes simplex 04/27/2017    Past Surgical History:  Procedure Laterality Date   MOUTH SURGERY       OB History     Gravida  2   Para  1   Term  1   Preterm      AB      Living  1      SAB      IAB      Ectopic      Multiple  0   Live Births  1           History reviewed. No pertinent family history.  Social History   Tobacco Use   Smoking status: Never   Smokeless tobacco: Never  Substance Use Topics   Alcohol use: No   Drug use: No    Home  Medications Prior to Admission medications   Medication Sig Start Date End Date Taking? Authorizing Provider  cephALEXin (KEFLEX) 500 MG capsule Take 1 capsule (500 mg total) by mouth 2 (two) times daily for 7 days. 06/09/21 06/16/21 Yes Tanda Rockers A, DO  ondansetron (ZOFRAN) 4 MG tablet Take 1 tablet (4 mg total) by mouth every 8 (eight) hours as needed for nausea or vomiting. 06/09/21  Yes Tanda Rockers A, DO  pyridOXINE (VITAMIN B-6) 100 MG tablet Take 100 mg by mouth daily.   Yes [provider]  dicyclomine (BENTYL) 20 MG tablet Take 1 tablet (20 mg total) by mouth 2 (two) times daily. 12/29/19   Dartha Lodge, PA-C  Doxylamine-Pyridoxine 10-10 MG TBEC Take 2 tabs at bedtime. If symptoms are controlled, continue taking 2 tabs at bedtime. If symptoms persist, take 2 tabs at bedtime, then 1 tab in the morning of Day 3 and 2 tabs at bedtime. If symptoms are controlled on Day 4, continue as scheduled. If symptoms are not controlled, increase dose to 1 tab in the morning, 1 tab midafternoon, and 2 tabs in the evening. Take as scheduled  and not on an as needed basis. Max: 4 tabs daily. 06/04/21   Harris, Cammy Copa, PA-C  famotidine (PEPCID) 20 MG tablet Take 1 tablet (20 mg total) by mouth 2 (two) times daily. 12/29/19   Dartha Lodge, PA-C  ondansetron (ZOFRAN ODT) 4 MG disintegrating tablet 4mg  ODT q4 hours prn nausea/vomit 12/29/19   12/31/19, PA-C  Prenatal Vit-Fe Fumarate-FA (PRENATAL MULTIVITAMIN) TABS tablet Take 1 tablet by mouth daily at 12 noon.    [provider]    Allergies    Patient has no known allergies.  Review of Systems   Review of Systems  Constitutional:  Positive for fatigue. Negative for activity change and fever.  HENT:  Negative for facial swelling and trouble swallowing.   Eyes:  Negative for discharge and redness.  Respiratory:  Negative for cough and shortness of breath.   Cardiovascular:  Negative for chest pain and palpitations.   Gastrointestinal:  Positive for abdominal pain, nausea and vomiting.  Genitourinary:  Negative for dysuria and flank pain.  Musculoskeletal:  Negative for back pain and gait problem.  Skin:  Negative for pallor and rash.  Neurological:  Negative for syncope and headaches.   Physical Exam Updated Vital Signs BP 105/63   Pulse 76   Temp 98.3 F (36.8 C) (Oral)   Resp 16   LMP 04/18/2021 (Exact Date)   SpO2 100%   Physical Exam Vitals and nursing note reviewed.  Constitutional:      General: She is not in acute distress.    Appearance: Normal appearance.  HENT:     Head: Normocephalic and atraumatic.     Right Ear: External ear normal.     Left Ear: External ear normal.     Nose: Nose normal.     Mouth/Throat:     Mouth: Mucous membranes are moist.  Eyes:     General: No scleral icterus.       Right eye: No discharge.        Left eye: No discharge.  Cardiovascular:     Rate and Rhythm: Normal rate and regular rhythm.     Pulses: Normal pulses.     Heart sounds: Normal heart sounds.  Pulmonary:     Effort: Pulmonary effort is normal. No respiratory distress.     Breath sounds: Normal breath sounds.  Abdominal:     General: Abdomen is flat.     Palpations: Abdomen is soft.     Tenderness: There is no abdominal tenderness.  Musculoskeletal:        General: Normal range of motion.     Cervical back: Normal range of motion.     Right lower leg: No edema.     Left lower leg: No edema.  Skin:    General: Skin is warm and dry.     Capillary Refill: Capillary refill takes less than 2 seconds.  Neurological:     Mental Status: She is alert.  Psychiatric:        Mood and Affect: Mood normal.        Behavior: Behavior normal.    ED Results / Procedures / Treatments   Labs (all labs ordered are listed, but only abnormal results are displayed) Labs Reviewed  CBC WITH DIFFERENTIAL/PLATELET - Abnormal; Notable for the following components:      Result Value   Hemoglobin  11.3 (*)    HCT 34.5 (*)    MCV 79.3 (*)    RDW 17.0 (*)  All other components within normal limits  COMPREHENSIVE METABOLIC PANEL - Abnormal; Notable for the following components:   Potassium 3.4 (*)    Alkaline Phosphatase 32 (*)    Total Bilirubin 1.3 (*)    All other components within normal limits  URINALYSIS, ROUTINE W REFLEX MICROSCOPIC - Abnormal; Notable for the following components:   APPearance HAZY (*)    Glucose, UA >=500 (*)    Ketones, ur 80 (*)    All other components within normal limits  HCG, QUANTITATIVE, PREGNANCY - Abnormal; Notable for the following components:   hCG, Beta Chain, Quant, S 296,034 (*)    All other components within normal limits  URINALYSIS, MICROSCOPIC (REFLEX) - Abnormal; Notable for the following components:   Bacteria, UA RARE (*)    All other components within normal limits    EKG None  Radiology No results found.  Procedures Procedures   Medications Ordered in ED Medications  dextrose 5 % and 0.9% NaCl 5-0.9 % bolus 1,000 mL (0 mLs Intravenous Stopped 06/09/21 0924)  ondansetron (ZOFRAN) injection 4 mg (4 mg Intravenous Given 06/09/21 0800)    ED Course  I have reviewed the triage vital signs and the nursing notes.  Pertinent labs & imaging results that were available during my care of the patient were reviewed by me and considered in my medical decision making (see chart for details).    MDM Rules/Calculators/A&P                           CC: emesis  This patient complains of above; this involves an extensive number of treatment options and is a complaint that carries with it a high risk of complications and morbidity. Vital signs were reviewed. Serious etiologies considered.  Record review:  Previous records obtained and reviewed   Work up as above, notable for:  Lab results that were available during my care of the patient were reviewed by me and considered in my medical decision making.   Management: Give  IVF, zofran  Reassessment:  Patient reports that she is feeling significantly better.  She is able to tolerate oral intake without difficulty.  Her nausea has resolved entirely.  Patient is ambulatory.  Labs reviewed and are stable.  Patient's beta-hCG is elevated consistent with her current trimester.  Recommend she follows up with OB/GYN and PCP regarding ongoing care.  D/w pt supportive care regarding nausea and vomiting including pyridoxine, doxylamine, ginger, small meals, antiemetics.  Discussed oral rehydration at length.  She does have bacteria in urine, given first asymptomatic bacteria.  Will treat with antibiotics.  The patient improved significantly and was discharged in stable condition. Detailed discussions were had with the patient regarding current findings, and need for close f/u with PCP or on call doctor. The patient has been instructed to return immediately if the symptoms worsen in any way for re-evaluation. Patient verbalized understanding and is in agreement with current care plan. All questions answered prior to discharge.           This chart was dictated using voice recognition software.  Despite best efforts to proofread,  errors can occur which can change the documentation meaning.  Final Clinical Impression(s) / ED Diagnoses Final diagnoses:  Nausea and vomiting in pregnancy prior to [redacted] weeks gestation  Asymptomatic bacteriuria during pregnancy in first trimester    Rx / DC Orders ED Discharge Orders          Ordered  ondansetron (ZOFRAN) 4 MG tablet  Every 8 hours PRN        06/09/21 1139    cephALEXin (KEFLEX) 500 MG capsule  2 times daily        06/09/21 1144             Sloan Leiter, DO 06/09/21 1145

## 2021-06-09 NOTE — ED Notes (Signed)
ED Provider at bedside. 

## 2021-06-11 LAB — URINE CULTURE: Culture: <10000 — AB

## 2021-06-15 ENCOUNTER — Encounter (HOSPITAL_BASED_OUTPATIENT_CLINIC_OR_DEPARTMENT_OTHER): Payer: Self-pay

## 2021-06-15 ENCOUNTER — Emergency Department (HOSPITAL_BASED_OUTPATIENT_CLINIC_OR_DEPARTMENT_OTHER)
Admission: EM | Admit: 2021-06-15 | Discharge: 2021-06-15 | Disposition: A | Discharge status: Home / Self Care | Payer: Medicaid Other | Attending: Emergency Medicine | Admitting: Emergency Medicine

## 2021-06-15 ENCOUNTER — Other Ambulatory Visit: Payer: Self-pay

## 2021-06-15 DIAGNOSIS — R112 Nausea with vomiting, unspecified: Secondary | ICD-10-CM | POA: Insufficient documentation

## 2021-06-15 LAB — CBC WITH DIFFERENTIAL/PLATELET
Abs Immature Granulocytes: 0.01 10*3/uL (ref 0.00–0.07)
Basophils Absolute: 0 10*3/uL (ref 0.0–0.1)
Basophils Relative: 1 %
Eosinophils Absolute: 0 10*3/uL (ref 0.0–0.5)
Eosinophils Relative: 0 %
HCT: 37.5 % (ref 36.0–46.0)
Hemoglobin: 12.4 g/dL (ref 12.0–15.0)
Immature Granulocytes: 0 %
Lymphocytes Relative: 9 %
Lymphs Abs: 0.7 10*3/uL (ref 0.7–4.0)
MCH: 25.9 pg — ABNORMAL LOW (ref 26.0–34.0)
MCHC: 33.1 g/dL (ref 30.0–36.0)
MCV: 78.5 fL — ABNORMAL LOW (ref 80.0–100.0)
Monocytes Absolute: 0.5 10*3/uL (ref 0.1–1.0)
Monocytes Relative: 6 %
Neutro Abs: 6.9 10*3/uL (ref 1.7–7.7)
Neutrophils Relative %: 84 %
Platelets: 181 10*3/uL (ref 150–400)
RBC: 4.78 MIL/uL (ref 3.87–5.11)
RDW: 16.8 % — ABNORMAL HIGH (ref 11.5–15.5)
WBC: 8.2 10*3/uL (ref 4.0–10.5)
nRBC: 0 % (ref 0.0–0.2)

## 2021-06-15 LAB — BASIC METABOLIC PANEL
Anion gap: 17 — ABNORMAL HIGH (ref 5–15)
BUN: 23 mg/dL — ABNORMAL HIGH (ref 6–20)
CO2: 21 mmol/L — ABNORMAL LOW (ref 22–32)
Calcium: 9.7 mg/dL (ref 8.9–10.3)
Chloride: 97 mmol/L — ABNORMAL LOW (ref 98–111)
Creatinine, Ser: 0.87 mg/dL (ref 0.44–1.00)
GFR, Estimated: >60 mL/min (ref >60)
Glucose, Bld: 94 mg/dL (ref 70–99)
Potassium: 3.2 mmol/L — ABNORMAL LOW (ref 3.5–5.1)
Sodium: 135 mmol/L (ref 135–145)

## 2021-06-15 LAB — MAGNESIUM: Magnesium: 2.2 mg/dL (ref 1.7–2.4)

## 2021-06-15 MED ORDER — SODIUM CHLORIDE 0.9 % IV BOLUS
1000.0000 mL | Freq: Once | INTRAVENOUS | Status: AC
  Administered 2021-06-15: 1000 mL via INTRAVENOUS

## 2021-06-15 MED ORDER — POTASSIUM CHLORIDE CRYS ER 20 MEQ PO TBCR
40.0000 meq | EXTENDED_RELEASE_TABLET | Freq: Once | ORAL | Status: AC
  Administered 2021-06-15: 40 meq via ORAL
  Filled 2021-06-15: qty 2

## 2021-06-15 MED ORDER — ONDANSETRON HCL 4 MG/2ML IJ SOLN
4.0000 mg | Freq: Once | INTRAMUSCULAR | Status: AC
  Administered 2021-06-15: 4 mg via INTRAVENOUS
  Filled 2021-06-15: qty 2

## 2021-06-15 NOTE — ED Triage Notes (Signed)
Pt arrives ambulatory to ED with c/o recurrent vomiting, states she is [redacted] weeks pregnant, has upcoming visit with OB but has not seen them in person yet, appointment is scheduled for Dec. 20th. Pt was sent home with Zofran, states it helps for a little while but not long.

## 2021-06-15 NOTE — Discharge Instructions (Signed)
Call your primary care doctor or specialist as discussed in the next 2-3 days.   Return immediately back to the ER if:  Your symptoms worsen within the next 12-24 hours. You develop new symptoms such as new fevers, persistent vomiting, new pain, shortness of breath, or new weakness or numbness, or if you have any other concerns.  

## 2021-06-15 NOTE — ED Provider Notes (Signed)
[facility] EMERGENCY DEPARTMENT Provider Note   CSN: [id number] Arrival date & time: 06/15/21  2751     History Chief Complaint  Patient presents with   Emesis    Patricia Craig is a 25 y.o. female.  Patient is [redacted] weeks pregnant, presenting with nausea vomiting ongoing for the past several weeks off and on.  She has been to the ER few times before in the past for similar symptoms.  She has been taking Zofran at home and states is not working well for her and presents back to the ER.  Denies any fevers denies any cough denies any abdominal pain denies any vaginal discharge or bleeding.  Denies any abdominal pain.      Past Medical History:  Diagnosis Date   Anxiety    Herpes simplex type 2 infection    Medical history non-contributory     Patient Active Problem List   Diagnosis Date Noted   Genital herpes simplex 04/27/2017    Past Surgical History:  Procedure Laterality Date   MOUTH SURGERY       OB History     Gravida  2   Para  1   Term  1   Preterm      AB      Living  1      SAB      IAB      Ectopic      Multiple  0   Live Births  1           No family history on file.  Social History   Tobacco Use   Smoking status: Never   Smokeless tobacco: Never  Vaping Use   Vaping Use: Never used  Substance Use Topics   Alcohol use: No   Drug use: No    Home Medications Prior to Admission medications   Medication Sig Start Date End Date Taking? Authorizing Provider  cephALEXin (KEFLEX) 500 MG capsule Take 1 capsule (500 mg total) by mouth 2 (two) times daily for 7 days. 06/09/21 06/16/21  Sloan Leiter, DO  dicyclomine (BENTYL) 20 MG tablet Take 1 tablet (20 mg total) by mouth 2 (two) times daily. 12/29/19   Dartha Lodge, PA-C  Doxylamine-Pyridoxine 10-10 MG TBEC Take 2 tabs at bedtime. If symptoms are controlled, continue taking 2 tabs at bedtime. If symptoms persist, take 2 tabs at bedtime, then 1 tab in the morning of  Day 3 and 2 tabs at bedtime. If symptoms are controlled on Day 4, continue as scheduled. If symptoms are not controlled, increase dose to 1 tab in the morning, 1 tab midafternoon, and 2 tabs in the evening. Take as scheduled and not on an as needed basis. Max: 4 tabs daily. 06/04/21   Harris, Cammy Copa, PA-C  famotidine (PEPCID) 20 MG tablet Take 1 tablet (20 mg total) by mouth 2 (two) times daily. 12/29/19   Dartha Lodge, PA-C  ondansetron (ZOFRAN ODT) 4 MG disintegrating tablet 4mg  ODT q4 hours prn nausea/vomit 12/29/19   12/31/19, PA-C  ondansetron (ZOFRAN) 4 MG tablet Take 1 tablet (4 mg total) by mouth every 8 (eight) hours as needed for nausea or vomiting. 06/09/21   06/11/21, DO  Prenatal Vit-Fe Fumarate-FA (PRENATAL MULTIVITAMIN) TABS tablet Take 1 tablet by mouth daily at 12 noon.    [provider]  pyridOXINE (VITAMIN B-6) 100 MG tablet Take 100 mg by mouth daily.    [provider]  Allergies    Patient has no known allergies.  Review of Systems   Review of Systems  Constitutional:  Negative for fever.  HENT:  Negative for ear pain.   Eyes:  Negative for pain.  Respiratory:  Negative for cough.   Cardiovascular:  Negative for chest pain.  Gastrointestinal:  Negative for abdominal pain.  Genitourinary:  Negative for flank pain.  Musculoskeletal:  Negative for back pain.  Skin:  Negative for rash.  Neurological:  Negative for headaches.   Physical Exam Updated Vital Signs BP 112/84   Pulse 90   Temp 98.9 F (37.2 C) (Oral)   Resp 16   Ht 5\' 2"  (1.575 m)   Wt 54.4 kg   LMP 04/18/2021 (Exact Date)   SpO2 99%   BMI 21.95 kg/m   Physical Exam Constitutional:      General: She is not in acute distress.    Appearance: Normal appearance.  HENT:     Head: Normocephalic.     Nose: Nose normal.  Eyes:     Extraocular Movements: Extraocular movements intact.  Cardiovascular:     Rate and Rhythm: Normal rate.  Pulmonary:     Effort:  Pulmonary effort is normal.  Abdominal:     Tenderness: There is no abdominal tenderness. There is no guarding or rebound.  Musculoskeletal:        General: Normal range of motion.     Cervical back: Normal range of motion.  Neurological:     General: No focal deficit present.     Mental Status: She is alert. Mental status is at baseline.    ED Results / Procedures / Treatments   Labs (all labs ordered are listed, but only abnormal results are displayed) Labs Reviewed  CBC WITH DIFFERENTIAL/PLATELET - Abnormal; Notable for the following components:      Result Value   MCV 78.5 (*)    MCH 25.9 (*)    RDW 16.8 (*)    All other components within normal limits  BASIC METABOLIC PANEL - Abnormal; Notable for the following components:   Potassium 3.2 (*)    Chloride 97 (*)    CO2 21 (*)    BUN 23 (*)    Anion gap 17 (*)    All other components within normal limits  MAGNESIUM    EKG None  Radiology No results found.  Procedures Procedures   Medications Ordered in ED Medications  sodium chloride 0.9 % bolus 1,000 mL (0 mLs Intravenous Stopped 06/15/21 1125)  ondansetron (ZOFRAN) injection 4 mg (4 mg Intravenous Given 06/15/21 1003)  potassium chloride SA (KLOR-CON M) CR tablet 40 mEq (40 mEq Oral Given 06/15/21 1047)  sodium chloride 0.9 % bolus 1,000 mL (1,000 mLs Intravenous New Bag/Given 06/15/21 1126)    ED Course  I have reviewed the triage vital signs and the nursing notes.  Pertinent labs & imaging results that were available during my care of the patient were reviewed by me and considered in my medical decision making (see chart for details).    MDM Rules/Calculators/A&P                           Labs are sent chemistry shows potassium 3.2.  Given 69 M EQ's of potassium here in the ER.  Vital signs otherwise stable.  Patient given IV fluids and Zofran with improvement of symptoms, not tolerating oral hydration and intake.  Recommending continued  outpatient follow-up with her  OB/GYN, advised return if she is unable to keep down any fluids or if she has any additional concerns.  Final Clinical Impression(s) / ED Diagnoses Final diagnoses:  Nausea and vomiting, unspecified vomiting type    Rx / DC Orders ED Discharge Orders     None        Cheryll Cockayne, MD 06/15/21 1133

## 2021-06-17 ENCOUNTER — Inpatient Hospital Stay (HOSPITAL_BASED_OUTPATIENT_CLINIC_OR_DEPARTMENT_OTHER)
Admission: AD | Admit: 2021-06-17 | Discharge: 2021-06-21 | DRG: 831 | Disposition: A | Discharge status: Home / Self Care | Payer: Medicaid Other | Attending: Family Medicine | Admitting: Family Medicine

## 2021-06-17 ENCOUNTER — Other Ambulatory Visit: Payer: Self-pay

## 2021-06-17 ENCOUNTER — Encounter (HOSPITAL_BASED_OUTPATIENT_CLINIC_OR_DEPARTMENT_OTHER): Payer: Self-pay | Admitting: *Deleted

## 2021-06-17 DIAGNOSIS — Z3A09 9 weeks gestation of pregnancy: Secondary | ICD-10-CM | POA: Diagnosis not present

## 2021-06-17 DIAGNOSIS — O211 Hyperemesis gravidarum with metabolic disturbance: Principal | ICD-10-CM | POA: Diagnosis present

## 2021-06-17 DIAGNOSIS — O210 Mild hyperemesis gravidarum: Secondary | ICD-10-CM | POA: Diagnosis present

## 2021-06-17 DIAGNOSIS — E876 Hypokalemia: Secondary | ICD-10-CM

## 2021-06-17 DIAGNOSIS — E0590 Thyrotoxicosis, unspecified without thyrotoxic crisis or storm: Secondary | ICD-10-CM | POA: Diagnosis present

## 2021-06-17 DIAGNOSIS — Z20822 Contact with and (suspected) exposure to covid-19: Secondary | ICD-10-CM | POA: Diagnosis present

## 2021-06-17 DIAGNOSIS — O99281 Endocrine, nutritional and metabolic diseases complicating pregnancy, first trimester: Secondary | ICD-10-CM | POA: Diagnosis present

## 2021-06-17 DIAGNOSIS — E43 Unspecified severe protein-calorie malnutrition: Secondary | ICD-10-CM | POA: Diagnosis present

## 2021-06-17 DIAGNOSIS — Z3A08 8 weeks gestation of pregnancy: Secondary | ICD-10-CM | POA: Diagnosis not present

## 2021-06-17 DIAGNOSIS — O2511 Malnutrition in pregnancy, first trimester: Secondary | ICD-10-CM | POA: Diagnosis present

## 2021-06-17 LAB — URINALYSIS, ROUTINE W REFLEX MICROSCOPIC
Glucose, UA: NEGATIVE mg/dL
Hgb urine dipstick: NEGATIVE
Ketones, ur: >=80 mg/dL — AB
Nitrite: NEGATIVE
Protein, ur: 30 mg/dL — AB
Specific Gravity, Urine: >=1.03 (ref 1.005–1.030)
pH: 6 (ref 5.0–8.0)

## 2021-06-17 LAB — COMPREHENSIVE METABOLIC PANEL
ALT: 11 U/L (ref 0–44)
AST: 17 U/L (ref 15–41)
Albumin: 4.4 g/dL (ref 3.5–5.0)
Alkaline Phosphatase: 34 U/L — ABNORMAL LOW (ref 38–126)
Anion gap: 16 — ABNORMAL HIGH (ref 5–15)
BUN: 16 mg/dL (ref 6–20)
CO2: 18 mmol/L — ABNORMAL LOW (ref 22–32)
Calcium: 9.3 mg/dL (ref 8.9–10.3)
Chloride: 104 mmol/L (ref 98–111)
Creatinine, Ser: 0.65 mg/dL (ref 0.44–1.00)
GFR, Estimated: >60 mL/min (ref >60)
Glucose, Bld: 78 mg/dL (ref 70–99)
Potassium: 3.5 mmol/L (ref 3.5–5.1)
Sodium: 138 mmol/L (ref 135–145)
Total Bilirubin: 1.5 mg/dL — ABNORMAL HIGH (ref 0.3–1.2)
Total Protein: 7.8 g/dL (ref 6.5–8.1)

## 2021-06-17 LAB — RESP PANEL BY RT-PCR (FLU A&B, COVID) ARPGX2
Influenza A by PCR: NEGATIVE
Influenza B by PCR: NEGATIVE
SARS Coronavirus 2 by RT PCR: NEGATIVE

## 2021-06-17 LAB — URINALYSIS, MICROSCOPIC (REFLEX)

## 2021-06-17 LAB — CBC
HCT: 35.6 % — ABNORMAL LOW (ref 36.0–46.0)
Hemoglobin: 11.4 g/dL — ABNORMAL LOW (ref 12.0–15.0)
MCH: 25.7 pg — ABNORMAL LOW (ref 26.0–34.0)
MCHC: 32 g/dL (ref 30.0–36.0)
MCV: 80.4 fL (ref 80.0–100.0)
Platelets: 169 10*3/uL (ref 150–400)
RBC: 4.43 MIL/uL (ref 3.87–5.11)
RDW: 17.1 % — ABNORMAL HIGH (ref 11.5–15.5)
WBC: 8.3 10*3/uL (ref 4.0–10.5)
nRBC: 0 % (ref 0.0–0.2)

## 2021-06-17 MED ORDER — FOLIC ACID 1 MG PO TABS
1.0000 mg | ORAL_TABLET | Freq: Every day | ORAL | Status: DC
  Administered 2021-06-18 – 2021-06-20 (×3): 1 mg via ORAL
  Filled 2021-06-17 (×3): qty 1

## 2021-06-17 MED ORDER — FOLIC ACID 5 MG/ML IJ SOLN
1.0000 mg | Freq: Every day | INTRAMUSCULAR | Status: DC
  Administered 2021-06-17: 1 mg via INTRAVENOUS
  Filled 2021-06-17 (×6): qty 0.2

## 2021-06-17 MED ORDER — ONDANSETRON 4 MG PO TBDP: 4.0000 mg | ORAL_TABLET | Freq: Three times a day (TID) | ORAL | Status: DC | PRN

## 2021-06-17 MED ORDER — FAMOTIDINE 20 MG PO TABS
20.0000 mg | ORAL_TABLET | Freq: Two times a day (BID) | ORAL | Status: DC
  Administered 2021-06-19 – 2021-06-20 (×4): 20 mg via ORAL
  Filled 2021-06-17 (×4): qty 1

## 2021-06-17 MED ORDER — HYDROXYZINE HCL 50 MG PO TABS: 50.0000 mg | ORAL_TABLET | Freq: Four times a day (QID) | ORAL | Status: DC | PRN

## 2021-06-17 MED ORDER — METOCLOPRAMIDE HCL 10 MG PO TABS
10.0000 mg | ORAL_TABLET | Freq: Four times a day (QID) | ORAL | Status: DC
  Administered 2021-06-19 – 2021-06-21 (×9): 10 mg via ORAL
  Filled 2021-06-17 (×9): qty 1

## 2021-06-17 MED ORDER — SODIUM CHLORIDE 0.9 % IV SOLN
8.0000 mg | Freq: Three times a day (TID) | INTRAVENOUS | Status: DC | PRN
  Filled 2021-06-17: qty 4

## 2021-06-17 MED ORDER — METOCLOPRAMIDE HCL 5 MG/ML IJ SOLN
10.0000 mg | Freq: Once | INTRAMUSCULAR | Status: AC
  Administered 2021-06-17: 10 mg via INTRAVENOUS
  Filled 2021-06-17: qty 2

## 2021-06-17 MED ORDER — LACTATED RINGERS IV SOLN: INTRAVENOUS | Status: DC

## 2021-06-17 MED ORDER — PROMETHAZINE HCL 25 MG PO TABS: 12.5000 mg | ORAL_TABLET | ORAL | Status: DC | PRN

## 2021-06-17 MED ORDER — THIAMINE HCL 100 MG PO TABS
100.0000 mg | ORAL_TABLET | Freq: Every day | ORAL | Status: DC
  Administered 2021-06-19 – 2021-06-20 (×2): 100 mg via ORAL
  Filled 2021-06-17 (×6): qty 1

## 2021-06-17 MED ORDER — PROMETHAZINE HCL 12.5 MG RE SUPP
12.5000 mg | RECTAL | Status: DC | PRN
  Filled 2021-06-17: qty 2

## 2021-06-17 MED ORDER — LACTATED RINGERS IV BOLUS
1000.0000 mL | Freq: Once | INTRAVENOUS | Status: AC
  Administered 2021-06-17: 1000 mL via INTRAVENOUS

## 2021-06-17 MED ORDER — ONDANSETRON HCL 4 MG/2ML IJ SOLN: 4.0000 mg | Freq: Three times a day (TID) | INTRAMUSCULAR | Status: DC | PRN

## 2021-06-17 MED ORDER — FAMOTIDINE IN NACL 20-0.9 MG/50ML-% IV SOLN
20.0000 mg | Freq: Two times a day (BID) | INTRAVENOUS | Status: DC
  Administered 2021-06-17 – 2021-06-18 (×3): 20 mg via INTRAVENOUS
  Filled 2021-06-17 (×4): qty 50

## 2021-06-17 MED ORDER — HYDROXYZINE HCL 50 MG/ML IM SOLN
50.0000 mg | Freq: Four times a day (QID) | INTRAMUSCULAR | Status: DC | PRN
  Filled 2021-06-17: qty 1

## 2021-06-17 MED ORDER — ADULT MULTIVITAMIN W/MINERALS CH
1.0000 | ORAL_TABLET | Freq: Every day | ORAL | Status: DC
  Filled 2021-06-17 (×2): qty 1

## 2021-06-17 MED ORDER — THIAMINE HCL 100 MG/ML IJ SOLN
100.0000 mg | Freq: Every day | INTRAMUSCULAR | Status: DC
  Administered 2021-06-17 – 2021-06-18 (×2): 100 mg via INTRAVENOUS
  Filled 2021-06-17 (×5): qty 1

## 2021-06-17 MED ORDER — METOCLOPRAMIDE HCL 5 MG/ML IJ SOLN
10.0000 mg | Freq: Four times a day (QID) | INTRAMUSCULAR | Status: DC
  Administered 2021-06-17 – 2021-06-19 (×6): 10 mg via INTRAVENOUS
  Filled 2021-06-17 (×6): qty 2

## 2021-06-17 NOTE — ED Notes (Addendum)
Pt to [facility] hospital via [facility].  Pt A&Ox3, no distress noted.  Nausea meds given prior to leaving.  Family at bedside, notified of transfer.

## 2021-06-17 NOTE — ED Provider Notes (Addendum)
[facility] EMERGENCY DEPARTMENT Provider Note   CSN: [id number] Arrival date & time: 06/17/21  6759     History Chief Complaint  Patient presents with   Emesis During Pregnancy    Patricia Craig is a 25 y.o. female.  HPI Patient presents with continued nausea and vomiting.  Is around 8 to [redacted] weeks pregnant.  Has had visits to the ER for nausea and vomiting.  Continues to have nausea vomiting.  Has been on Zofran.  Has been on B6.  Has been unable to keep things down.  States she feels that she is losing a lot of weight.  She is 49.6 kg today.  2 days ago she was 54 kg.  55.8 kg on November 11.  Last menses was October 3.  States she has not had a bowel movement around 3 weeks.  States she does have some abdominal cramping.  States everything she has tried at home and food just comes up.  She is not even really hungry.  Feels lightheaded and dizzy    Past Medical History:  Diagnosis Date   Anxiety    Herpes simplex type 2 infection    Medical history non-contributory     Patient Active Problem List   Diagnosis Date Noted   Genital herpes simplex 04/27/2017    Past Surgical History:  Procedure Laterality Date   MOUTH SURGERY       OB History     Gravida  2   Para  1   Term  1   Preterm      AB      Living  1      SAB      IAB      Ectopic      Multiple  0   Live Births  1           No family history on file.  Social History   Tobacco Use   Smoking status: Never   Smokeless tobacco: Never  Vaping Use   Vaping Use: Never used  Substance Use Topics   Alcohol use: No   Drug use: No    Home Medications Prior to Admission medications   Medication Sig Start Date End Date Taking? Authorizing Provider  dicyclomine (BENTYL) 20 MG tablet Take 1 tablet (20 mg total) by mouth 2 (two) times daily. 12/29/19   Dartha Lodge, PA-C  Doxylamine-Pyridoxine 10-10 MG TBEC Take 2 tabs at bedtime. If symptoms are controlled, continue taking 2  tabs at bedtime. If symptoms persist, take 2 tabs at bedtime, then 1 tab in the morning of Day 3 and 2 tabs at bedtime. If symptoms are controlled on Day 4, continue as scheduled. If symptoms are not controlled, increase dose to 1 tab in the morning, 1 tab midafternoon, and 2 tabs in the evening. Take as scheduled and not on an as needed basis. Max: 4 tabs daily. 06/04/21   Harris, Cammy Copa, PA-C  famotidine (PEPCID) 20 MG tablet Take 1 tablet (20 mg total) by mouth 2 (two) times daily. 12/29/19   Dartha Lodge, PA-C  ondansetron (ZOFRAN ODT) 4 MG disintegrating tablet 4mg  ODT q4 hours prn nausea/vomit 12/29/19   12/31/19, PA-C  ondansetron (ZOFRAN) 4 MG tablet Take 1 tablet (4 mg total) by mouth every 8 (eight) hours as needed for nausea or vomiting. 06/09/21   06/11/21, DO  Prenatal Vit-Fe Fumarate-FA (PRENATAL MULTIVITAMIN) TABS tablet Take 1 tablet by mouth daily at  12 noon.    [provider]  pyridOXINE (VITAMIN B-6) 100 MG tablet Take 100 mg by mouth daily.    [provider]    Allergies    Patient has no known allergies.  Review of Systems   Review of Systems  Constitutional:  Positive for appetite change and unexpected weight change.  HENT:  Negative for congestion.   Respiratory:  Negative for shortness of breath.   Cardiovascular:  Negative for chest pain.  Gastrointestinal:  Positive for abdominal pain.  Genitourinary:  Negative for dysuria.  Musculoskeletal:  Negative for back pain.  Skin:  Negative for rash.  Neurological:  Positive for light-headedness.   Physical Exam Updated Vital Signs BP 110/64   Pulse 90   Temp 98.4 F (36.9 C) (Oral)   Resp 16   Ht 5\' 2"  (1.575 m)   Wt 49.6 kg   LMP 04/18/2021 (Exact Date)   SpO2 96%   BMI 20.01 kg/m   Physical Exam Vitals and nursing note reviewed.  Constitutional:      Comments: Patient is very slender.  HENT:     Head: Normocephalic.  Eyes:     General: No scleral  icterus. Cardiovascular:     Rate and Rhythm: Regular rhythm.  Abdominal:     Comments: Mild diffuse abdominal tenderness.  No rebound or guarding.  No hernias palpated.  Musculoskeletal:        General: No tenderness.     Cervical back: Neck supple.  Skin:    General: Skin is warm.     Capillary Refill: Capillary refill takes less than 2 seconds.  Neurological:     Mental Status: She is alert and oriented to person, place, and time.    ED Results / Procedures / Treatments   Labs (all labs ordered are listed, but only abnormal results are displayed) Labs Reviewed  CBC - Abnormal; Notable for the following components:      Result Value   Hemoglobin 11.4 (*)    HCT 35.6 (*)    MCH 25.7 (*)    RDW 17.1 (*)    All other components within normal limits  COMPREHENSIVE METABOLIC PANEL - Abnormal; Notable for the following components:   CO2 18 (*)    Alkaline Phosphatase 34 (*)    Total Bilirubin 1.5 (*)    Anion gap 16 (*)    All other components within normal limits  URINALYSIS, ROUTINE W REFLEX MICROSCOPIC - Abnormal; Notable for the following components:   Bilirubin Urine SMALL (*)    Ketones, ur >=80 (*)    Protein, ur 30 (*)    Leukocytes,Ua SMALL (*)    All other components within normal limits  URINALYSIS, MICROSCOPIC (REFLEX) - Abnormal; Notable for the following components:   Bacteria, UA MANY (*)    All other components within normal limits  URINE CULTURE    EKG None  Radiology No results found.  Procedures Procedures   Medications Ordered in ED Medications  lactated ringers bolus 1,000 mL (0 mLs Intravenous Stopped 06/17/21 1336)  metoCLOPramide (REGLAN) injection 10 mg (10 mg Intravenous Given 06/17/21 1243)    ED Course  I have reviewed the triage vital signs and the nursing notes.  Pertinent labs & imaging results that were available during my care of the patient were reviewed by me and considered in my medical decision making (see chart for  details).    MDM Rules/Calculators/A&P  Patient with nausea and vomiting.  Is around 8 to [redacted] weeks pregnant.  Has been seen in the ER twice already this last week for vomiting.  Continues to have symptoms.  Has lost around 9 pounds over the last few days.  Despite Zofran and B6 has not kept medicines down.  Has not been on Phenergan at home.  Since the 19th this is her fourth visit for the same.  Blood work is somewhat reassuring but does have a bicarb of 18.  Potassium actually somewhat improved.  Has not seen OB yet for the pregnancy besides a visit to the [facility].  Has an appointment to follow-up with the women's clinic on December 20.  Feeling mildly better here but with loss of 10% of her body weight over the last few days it feels she would probably benefit from a more aggressive hydration including admission to the hospital.  Will discuss with Dr. Alvester Morin at [facility]  Discussed with Dr. Alvester Morin.  Really has not tolerated orals here.  Will admit to [facility] for further treatment.  Urine does show bacteria and some white cells.  I will send a culture.  Has already been on antibiotics. Final Clinical Impression(s) / ED Diagnoses Final diagnoses:  Hyperemesis gravidarum    Rx / DC Orders ED Discharge Orders     None        Benjiman Core, MD 06/17/21 1450    Benjiman Core, MD 06/17/21 [phone]

## 2021-06-17 NOTE — ED Triage Notes (Signed)
Vomiting, weakness, states 8 week preg,  states streaks of blood in vomit   ob md told her to go to er

## 2021-06-17 NOTE — ED Notes (Signed)
ED Provider at bedside. 

## 2021-06-17 NOTE — ED Notes (Signed)
Pt aware of need for urine specimen, unable to provide at this time, IV fluids infusing. 

## 2021-06-17 NOTE — ED Notes (Signed)
Pt continues with nausea, fatigue, ambulated to bathroom with steady gait to provide urine specimen

## 2021-06-17 NOTE — ED Notes (Signed)
[facility] to send next available truck for transport

## 2021-06-17 NOTE — ED Notes (Signed)
Pt reports some improvement in nausea with reglan.  Provided juice

## 2021-06-18 ENCOUNTER — Inpatient Hospital Stay (HOSPITAL_COMMUNITY): Payer: Medicaid Other

## 2021-06-18 DIAGNOSIS — O210 Mild hyperemesis gravidarum: Secondary | ICD-10-CM

## 2021-06-18 DIAGNOSIS — Z3A08 8 weeks gestation of pregnancy: Secondary | ICD-10-CM

## 2021-06-18 LAB — COMPREHENSIVE METABOLIC PANEL
ALT: 10 U/L (ref 0–44)
AST: 13 U/L — ABNORMAL LOW (ref 15–41)
Albumin: 2.9 g/dL — ABNORMAL LOW (ref 3.5–5.0)
Alkaline Phosphatase: 24 U/L — ABNORMAL LOW (ref 38–126)
Anion gap: 7 (ref 5–15)
BUN: 8 mg/dL (ref 6–20)
CO2: 23 mmol/L (ref 22–32)
Calcium: 8.3 mg/dL — ABNORMAL LOW (ref 8.9–10.3)
Chloride: 105 mmol/L (ref 98–111)
Creatinine, Ser: 0.62 mg/dL (ref 0.44–1.00)
GFR, Estimated: >60 mL/min (ref >60)
Glucose, Bld: 82 mg/dL (ref 70–99)
Potassium: 3 mmol/L — ABNORMAL LOW (ref 3.5–5.1)
Sodium: 135 mmol/L (ref 135–145)
Total Bilirubin: 1.4 mg/dL — ABNORMAL HIGH (ref 0.3–1.2)
Total Protein: 5.3 g/dL — ABNORMAL LOW (ref 6.5–8.1)

## 2021-06-18 LAB — MAGNESIUM: Magnesium: 1.5 mg/dL — ABNORMAL LOW (ref 1.7–2.4)

## 2021-06-18 LAB — TSH: TSH: 0.011 u[IU]/mL — ABNORMAL LOW (ref 0.350–4.500)

## 2021-06-18 MED ORDER — LACTATED RINGERS IV SOLN: INTRAVENOUS | Status: DC

## 2021-06-18 MED ORDER — METHYLPREDNISOLONE SODIUM SUCC 125 MG IJ SOLR
125.0000 mg | Freq: Two times a day (BID) | INTRAMUSCULAR | Status: AC
  Administered 2021-06-18 – 2021-06-19 (×3): 125 mg via INTRAVENOUS
  Filled 2021-06-18 (×3): qty 2

## 2021-06-18 MED ORDER — GLYCOPYRROLATE 1 MG PO TABS
2.0000 mg | ORAL_TABLET | Freq: Three times a day (TID) | ORAL | Status: DC
  Administered 2021-06-18 – 2021-06-20 (×9): 2 mg via ORAL
  Filled 2021-06-18 (×9): qty 2

## 2021-06-18 MED ORDER — POTASSIUM CHLORIDE 10 MEQ/100ML IV SOLN
10.0000 meq | INTRAVENOUS | Status: AC
  Administered 2021-06-18 (×4): 10 meq via INTRAVENOUS
  Filled 2021-06-18 (×4): qty 100

## 2021-06-18 MED ORDER — PREDNISONE 20 MG PO TABS
40.0000 mg | ORAL_TABLET | Freq: Every day | ORAL | Status: DC
  Administered 2021-06-19 – 2021-06-21 (×3): 40 mg via ORAL
  Filled 2021-06-18 (×3): qty 2

## 2021-06-18 MED ORDER — METOCLOPRAMIDE HCL 5 MG/ML IJ SOLN: 10.0000 mg | Freq: Three times a day (TID) | INTRAMUSCULAR | Status: DC

## 2021-06-18 MED ORDER — KCL-LACTATED RINGERS-D5W 20 MEQ/L IV SOLN
INTRAVENOUS | Status: DC
  Filled 2021-06-18 (×9): qty 1000

## 2021-06-18 NOTE — Progress Notes (Signed)
Patient ID: Patricia Craig, female   DOB: September 12, 1995, 25 y.o.   MRN: [id number] FACULTY PRACTICE ANTEPARTUM(COMPREHENSIVE) NOTE  Patricia Craig is a 25 y.o. G2P1001 with Estimated Date of Delivery: 01/23/22   By  early ultrasound [redacted]w[redacted]d  who is admitted for hyperemesis with ptyalism electrolyte disturbance.   Denies marijuana use Fetal presentation is . Length of Stay:  1  Days  Date of admission:06/17/2021  Subjective: Continues with nausea vomiting Patient reports the fetal movement as na. Patient reports uterine contraction  activity as none. Patient reports  vaginal bleeding as none. Patient describes fluid per vagina as None.  Vitals:  Blood pressure 121/86, pulse 81, temperature 98.3 F (36.8 C), temperature source Oral, resp. rate 16, height 5\' 2"  (1.575 m), weight 52.6 kg, last menstrual period 04/18/2021, SpO2 100 %, unknown if currently breastfeeding. Vitals:   06/17/21 1934 06/17/21 2051 06/18/21 0034 06/18/21 0415  BP: 122/79 111/68 (!) 99/57 121/86  Pulse: 83 76 69 81  Resp: 18 16 16 16   Temp: 98.9 F (37.2 C) 99.1 F (37.3 C) 98.5 F (36.9 C) 98.3 F (36.8 C)  TempSrc: Oral Oral Oral Oral  SpO2: 100% 100% 100% 100%  Weight:    52.6 kg  Height:       Physical Examination:  General appearance - mild distress Abdomen - soft, nontender, nondistended, no masses or organomegaly  Fetal Monitoring:   Labs:  Results for orders placed or performed during the hospital encounter of 06/17/21 (from the past 24 hour(s))  CBC   Collection Time: 06/17/21 12:25 PM  Result Value Ref Range   WBC 8.3 4.0 - 10.5 K/uL   RBC 4.43 3.87 - 5.11 MIL/uL   Hemoglobin 11.4 (L) 12.0 - 15.0 g/dL   HCT 14/02/22 (L) 14/02/22 - 55.7 %   MCV 80.4 80.0 - 100.0 fL   MCH 25.7 (L) 26.0 - 34.0 pg   MCHC 32.0 30.0 - 36.0 g/dL   RDW 32.2 (H) 02.5 - 42.7 %   Platelets 169 150 - 400 K/uL   nRBC 0.0 0.0 - 0.2 %  Comprehensive metabolic panel   Collection Time: 06/17/21 12:25 PM  Result Value Ref Range    Sodium 138 135 - 145 mmol/L   Potassium 3.5 3.5 - 5.1 mmol/L   Chloride 104 98 - 111 mmol/L   CO2 18 (L) 22 - 32 mmol/L   Glucose, Bld 78 70 - 99 mg/dL   BUN 16 6 - 20 mg/dL   Creatinine, Ser 37.6 0.44 - 1.00 mg/dL   Calcium 9.3 8.9 - 14/02/22 mg/dL   Total Protein 7.8 6.5 - 8.1 g/dL   Albumin 4.4 3.5 - 5.0 g/dL   AST 17 15 - 41 U/L   ALT 11 0 - 44 U/L   Alkaline Phosphatase 34 (L) 38 - 126 U/L   Total Bilirubin 1.5 (H) 0.3 - 1.2 mg/dL   GFR, Estimated 2.83 15.1 mL/min   Anion gap 16 (H) 5 - 15  Urinalysis, Routine w reflex microscopic Urine, Clean Catch   Collection Time: 06/17/21  2:42 PM  Result Value Ref Range   Color, Urine YELLOW YELLOW   APPearance CLEAR CLEAR   Specific Gravity, Urine >=1.030 1.005 - 1.030   pH 6.0 5.0 - 8.0   Glucose, UA NEGATIVE NEGATIVE mg/dL   Hgb urine dipstick NEGATIVE NEGATIVE   Bilirubin Urine SMALL (A) NEGATIVE   Ketones, ur >=80 (A) NEGATIVE mg/dL   Protein, ur 30 (A) NEGATIVE mg/dL  Nitrite NEGATIVE NEGATIVE   Leukocytes,Ua SMALL (A) NEGATIVE  Urinalysis, Microscopic (reflex)   Collection Time: 06/17/21  2:42 PM  Result Value Ref Range   RBC / HPF 0-5 0 - 5 RBC/hpf   WBC, UA 6-10 0 - 5 WBC/hpf   Bacteria, UA MANY (A) NONE SEEN   Squamous Epithelial / LPF 6-10 0 - 5   Mucus PRESENT   Resp Panel by RT-PCR (Flu A&B, Covid) Nasopharyngeal Swab   Collection Time: 06/17/21  3:46 PM   Specimen: Nasopharyngeal Swab; Nasopharyngeal(NP) swabs in vial transport medium  Result Value Ref Range   SARS Coronavirus 2 by RT PCR NEGATIVE NEGATIVE   Influenza A by PCR NEGATIVE NEGATIVE   Influenza B by PCR NEGATIVE NEGATIVE  Comprehensive metabolic panel   Collection Time: 06/18/21  4:48 AM  Result Value Ref Range   Sodium 135 135 - 145 mmol/L   Potassium 3.0 (L) 3.5 - 5.1 mmol/L   Chloride 105 98 - 111 mmol/L   CO2 23 22 - 32 mmol/L   Glucose, Bld 82 70 - 99 mg/dL   BUN 8 6 - 20 mg/dL   Creatinine, Ser 6.62 0.44 - 1.00 mg/dL   Calcium 8.3 (L) 8.9 -  10.3 mg/dL   Total Protein 5.3 (L) 6.5 - 8.1 g/dL   Albumin 2.9 (L) 3.5 - 5.0 g/dL   AST 13 (L) 15 - 41 U/L   ALT 10 0 - 44 U/L   Alkaline Phosphatase 24 (L) 38 - 126 U/L   Total Bilirubin 1.4 (H) 0.3 - 1.2 mg/dL   GFR, Estimated >94 >76 mL/min   Anion gap 7 5 - 15  Magnesium   Collection Time: 06/18/21  4:48 AM  Result Value Ref Range   Magnesium 1.5 (L) 1.7 - 2.4 mg/dL  TSH   Collection Time: 06/18/21  4:48 AM  Result Value Ref Range   TSH 0.011 (L) 0.350 - 4.500 uIU/mL    Imaging Studies:    No results found.   Medications:  Scheduled  famotidine  20 mg Oral Q12H   folic acid  1 mg Oral Daily   Or   folic acid  1 mg Intravenous Daily   glycopyrrolate  2 mg Oral TID   methylPREDNISolone (SOLU-MEDROL) injection  125 mg Intravenous Q12H   metoCLOPramide  10 mg Oral Q6H   Or   metoCLOPramide (REGLAN) injection  10 mg Intravenous Q6H   thiamine  100 mg Oral Daily   Or   thiamine  100 mg Intravenous Daily   I have reviewed the patient's current medications.  ASSESSMENT: G2P1001 [redacted]w[redacted]d Estimated Date of Delivery: 01/23/22  Hyperemesisi gravidarum with electrolyte disturbance Ptyalism hypokalemia   PLAN: No response overnight Will start robinul for her ptyalism Begin high dose steroids IV for 2 days then begin higher dose oral prednisone 40 mg or so for 2 weeks taper slowly every few weeks to off at 18 weeks assuming patient tolerates the decreasing doses(RCOG GTG #69 and leaflet from [place] [facility]))  [employer] 06/18/2021,7:30 AM

## 2021-06-18 NOTE — H&P (Signed)
History    Chief Complaint  Patient presents with   Emesis During Pregnancy      Patricia Craig is a 25 y.o. female.   HPI Patient presents with continued nausea and vomiting.  Is around 8 to [redacted] weeks pregnant.  Has had visits to the ER for nausea and vomiting.  Continues to have nausea vomiting.  Has been on Zofran.  Has been on B6.  Has been unable to keep things down.  States she feels that she is losing a lot of weight.  She is 49.6 kg today.  2 days ago she was 54 kg.  55.8 kg on November 11.  Last menses was October 3.  States she has not had a bowel movement around 3 weeks.  States she does have some abdominal cramping.  States everything she has tried at home and food just comes up.  She is not even really hungry.  Feels lightheaded and dizzy       Past Medical History:  Diagnosis Date   Anxiety     Herpes simplex type 2 infection     Medical history non-contributory            Patient Active Problem List    Diagnosis Date Noted   Genital herpes simplex 04/27/2017           Past Surgical History:  Procedure Laterality Date   MOUTH SURGERY                     OB History       Gravida  2   Para  1   Term  1   Preterm      AB      Living  1        SAB      IAB      Ectopic      Multiple  0   Live Births  1               No family history on file.   Social History        Tobacco Use   Smoking status: Never   Smokeless tobacco: Never  Vaping Use   Vaping Use: Never used  Substance Use Topics   Alcohol use: No   Drug use: No      Home Medications        Prior to Admission medications   Medication Sig Start Date End Date Taking? Authorizing Provider  dicyclomine (BENTYL) 20 MG tablet Take 1 tablet (20 mg total) by mouth 2 (two) times daily. 12/29/19     Dartha Lodge, PA-C  Doxylamine-Pyridoxine 10-10 MG TBEC Take 2 tabs at bedtime. If symptoms are controlled, continue taking 2 tabs at bedtime. If symptoms persist, take 2 tabs at  bedtime, then 1 tab in the morning of Day 3 and 2 tabs at bedtime. If symptoms are controlled on Day 4, continue as scheduled. If symptoms are not controlled, increase dose to 1 tab in the morning, 1 tab midafternoon, and 2 tabs in the evening. Take as scheduled and not on an as needed basis. Max: 4 tabs daily. 06/04/21     Harris, Cammy Copa, PA-C  famotidine (PEPCID) 20 MG tablet Take 1 tablet (20 mg total) by mouth 2 (two) times daily. 12/29/19     Dartha Lodge, PA-C  ondansetron (ZOFRAN ODT) 4 MG disintegrating tablet 4mg  ODT q4 hours prn nausea/vomit 12/29/19     12/31/19,  Arva Chafe, PA-C  ondansetron (ZOFRAN) 4 MG tablet Take 1 tablet (4 mg total) by mouth every 8 (eight) hours as needed for nausea or vomiting. 06/09/21     Sloan Leiter, DO  Prenatal Vit-Fe Fumarate-FA (PRENATAL MULTIVITAMIN) TABS tablet Take 1 tablet by mouth daily at 12 noon.       [provider]  pyridOXINE (VITAMIN B-6) 100 MG tablet Take 100 mg by mouth daily.       [provider]      Allergies            Patient has no known allergies.   Review of Systems   Review of Systems  Constitutional:  Positive for appetite change and unexpected weight change.  HENT:  Negative for congestion.   Respiratory:  Negative for shortness of breath.   Cardiovascular:  Negative for chest pain.  Gastrointestinal:  Positive for abdominal pain.  Genitourinary:  Negative for dysuria.  Musculoskeletal:  Negative for back pain.  Skin:  Negative for rash.  Neurological:  Positive for light-headedness.    Physical Exam Updated Vital Signs BP 110/64   Pulse 90   Temp 98.4 F (36.9 C) (Oral)   Resp 16   Ht 5\' 2"  (1.575 m)   Wt 49.6 kg   LMP 04/18/2021 (Exact Date)   SpO2 96%   BMI 20.01 kg/m    Physical Exam Vitals and nursing note reviewed.  Constitutional:      Comments: Patient is very slender.  HENT:     Head: Normocephalic.  Eyes:     General: No scleral icterus. Cardiovascular:     Rate and Rhythm:  Regular rhythm.  Abdominal:     Comments: Mild diffuse abdominal tenderness.  No rebound or guarding.  No hernias palpated.  Musculoskeletal:        General: No tenderness.     Cervical back: Neck supple.  Skin:    General: Skin is warm.     Capillary Refill: Capillary refill takes less than 2 seconds.  Neurological:     Mental Status: She is alert and oriented to person, place, and time.      ED Results / Procedures / Treatments   Labs (all labs ordered are listed, but only abnormal results are displayed)      Labs Reviewed  CBC - Abnormal; Notable for the following components:      Result Value     Hemoglobin 11.4 (*)      HCT 35.6 (*)      MCH 25.7 (*)      RDW 17.1 (*)      All other components within normal limits  COMPREHENSIVE METABOLIC PANEL - Abnormal; Notable for the following components:    CO2 18 (*)      Alkaline Phosphatase 34 (*)      Total Bilirubin 1.5 (*)      Anion gap 16 (*)      All other components within normal limits  URINALYSIS, ROUTINE W REFLEX MICROSCOPIC - Abnormal; Notable for the following components:    Bilirubin Urine SMALL (*)      Ketones, ur >=80 (*)      Protein, ur 30 (*)      Leukocytes,Ua SMALL (*)      All other components within normal limits  URINALYSIS, MICROSCOPIC (REFLEX) - Abnormal; Notable for the following components:    Bacteria, UA MANY (*)      All other components within normal limits  URINE CULTURE  EKG None   Radiology No results found.   Procedures Procedures    Medications Ordered in ED Medications  lactated ringers bolus 1,000 mL (0 mLs Intravenous Stopped 06/17/21 1336)  metoCLOPramide (REGLAN) injection 10 mg (10 mg Intravenous Given 06/17/21 1243)      ED Course  I have reviewed the triage vital signs and the nursing notes.   Pertinent labs & imaging results that were available during my care of the patient were reviewed by me and considered in my medical decision making (see chart for  details).   MDM Rules/Calculators/A&P                             Patient with nausea and vomiting.  Is around 8 to [redacted] weeks pregnant.  Has been seen in the ER twice already this last week for vomiting.  Continues to have symptoms.  Has lost around 9 pounds over the last few days.  Despite Zofran and B6 has not kept medicines down.  Has not been on Phenergan at home.  Since the 19th this is her fourth visit for the same.  Blood work is somewhat reassuring but does have a bicarb of 18.  Potassium actually somewhat improved.  Has not seen OB yet for the pregnancy besides a visit to the [facility].  Has an appointment to follow-up with the women's clinic on December 20.  Feeling mildly better here but with loss of 10% of her body weight over the last few days it feels she would probably benefit from a more aggressive hydration including admission to the hospital.  Will discuss with Dr. Alvester Morin at [facility]   Discussed with Dr. Alvester Morin.  Really has not tolerated orals here.  Will admit to [facility] for further treatment.  Urine does show bacteria and some white cells.  I will send a culture.  Has already been on antibiotics. Final Clinical Impression(s) / ED Diagnoses Final diagnoses:  Hyperemesis gravidarum     Attestation of Attending Supervision of Advanced Practitioner (CNM/NP/PA): Evaluation and management procedures were performed by the Advanced Practitioner under my supervision and collaboration.  I have reviewed the Advanced Practitioner's note and chart, and I agree with the management and plan.  Rockne Coons MD Attending Physician for the Center for [facility] 06/18/2021 7:48 AM

## 2021-06-19 LAB — URINE CULTURE

## 2021-06-19 LAB — COMPREHENSIVE METABOLIC PANEL
ALT: 8 U/L (ref 0–44)
AST: 17 U/L (ref 15–41)
Albumin: 3 g/dL — ABNORMAL LOW (ref 3.5–5.0)
Alkaline Phosphatase: 24 U/L — ABNORMAL LOW (ref 38–126)
Anion gap: 6 (ref 5–15)
BUN: <5 mg/dL — ABNORMAL LOW (ref 6–20)
CO2: 23 mmol/L (ref 22–32)
Calcium: 9.1 mg/dL (ref 8.9–10.3)
Chloride: 105 mmol/L (ref 98–111)
Creatinine, Ser: 0.53 mg/dL (ref 0.44–1.00)
GFR, Estimated: >60 mL/min (ref >60)
Glucose, Bld: 151 mg/dL — ABNORMAL HIGH (ref 70–99)
Potassium: 3.7 mmol/L (ref 3.5–5.1)
Sodium: 134 mmol/L — ABNORMAL LOW (ref 135–145)
Total Bilirubin: 1 mg/dL (ref 0.3–1.2)
Total Protein: 5.7 g/dL — ABNORMAL LOW (ref 6.5–8.1)

## 2021-06-19 LAB — T4, FREE: Free T4: 1.76 ng/dL — ABNORMAL HIGH (ref 0.61–1.12)

## 2021-06-19 LAB — MAGNESIUM: Magnesium: 1.4 mg/dL — ABNORMAL LOW (ref 1.7–2.4)

## 2021-06-19 MED ORDER — MAGNESIUM CHLORIDE 64 MG PO TBEC
1.0000 | DELAYED_RELEASE_TABLET | Freq: Every day | ORAL | Status: AC
  Administered 2021-06-19 – 2021-06-20 (×2): 64 mg via ORAL
  Filled 2021-06-19 (×2): qty 1

## 2021-06-19 NOTE — Progress Notes (Signed)
FACULTY PRACTICE ANTEPARTUM PROGRESS NOTE  Patricia Craig is a 25 y.o. G2P1001 at [redacted]w[redacted]d who is admitted for hyperemesis gravidarum.  Estimated Date of Delivery: 01/23/22 Fetal presentation is unsure.  Length of Stay:  2 Days. Admitted 06/17/2021  Subjective: Pt feels and looks much better today.  She has requested to eat today.   She denies uterine contractions, denies bleeding and leaking of fluid per vagina.  Vitals:  Blood pressure 111/61, pulse 65, temperature 98.7 F (37.1 C), temperature source Oral, resp. rate 16, height 5\' 2"  (1.575 m), weight 52.6 kg, last menstrual period 04/18/2021, SpO2 99 %, unknown if currently breastfeeding. Physical Examination: CONSTITUTIONAL: Well-developed, well-nourished female in no acute distress.  HENT:  Normocephalic, atraumatic, External right and left ear normal. Oropharynx is clear and moist EYES: Conjunctivae and EOM are normal.  NECK: Normal range of motion, supple, no masses. SKIN: Skin is warm and dry. No rash noted. Not diaphoretic. No erythema. No pallor. NEUROLGIC: Alert and oriented to person, place, and time. Normal reflexes, muscle tone coordination. No cranial nerve deficit noted. PSYCHIATRIC: Normal mood and affect. Normal behavior. Normal judgment and thought content. CARDIOVASCULAR: Normal heart rate noted, regular rhythm RESPIRATORY: Effort and breath sounds normal, no problems with respiration noted MUSCULOSKELETAL: Normal range of motion. No edema and no tenderness. ABDOMEN: Soft, nontender, nondistended, gravid. CERVIX: deferred   Results for orders placed or performed during the hospital encounter of 06/17/21 (from the past 48 hour(s))  CBC     Status: Abnormal   Collection Time: 06/17/21 12:25 PM  Result Value Ref Range   WBC 8.3 4.0 - 10.5 K/uL   RBC 4.43 3.87 - 5.11 MIL/uL   Hemoglobin 11.4 (L) 12.0 - 15.0 g/dL   HCT 14/02/22 (L) 98.9 - 21.1 %   MCV 80.4 80.0 - 100.0 fL   MCH 25.7 (L) 26.0 - 34.0 pg   MCHC 32.0 30.0 -  36.0 g/dL   RDW 94.1 (H) 74.0 - 81.4 %   Platelets 169 150 - 400 K/uL    Comment: REPEATED TO VERIFY   nRBC 0.0 0.0 - 0.2 %    Comment: Performed at [facility], [address] [place], [place]  Comprehensive metabolic panel     Status: Abnormal   Collection Time: 06/17/21 12:25 PM  Result Value Ref Range   Sodium 138 135 - 145 mmol/L   Potassium 3.5 3.5 - 5.1 mmol/L   Chloride 104 98 - 111 mmol/L   CO2 18 (L) 22 - 32 mmol/L   Glucose, Bld 78 70 - 99 mg/dL    Comment: Glucose reference range applies only to samples taken after fasting for at least 8 hours.   BUN 16 6 - 20 mg/dL   Creatinine, Ser 14/02/22 0.44 - 1.00 mg/dL   Calcium 9.3 8.9 - 4.97 mg/dL   Total Protein 7.8 6.5 - 8.1 g/dL   Albumin 4.4 3.5 - 5.0 g/dL   AST 17 15 - 41 U/L   ALT 11 0 - 44 U/L   Alkaline Phosphatase 34 (L) 38 - 126 U/L   Total Bilirubin 1.5 (H) 0.3 - 1.2 mg/dL   GFR, Estimated 02.6 >37 mL/min    Comment: (NOTE) Calculated using the CKD-EPI Creatinine Equation (2021)    Anion gap 16 (H) 5 - 15    Comment: Performed at [facility], [address] [place], [place]  Urinalysis, Routine w reflex microscopic Urine, Clean Catch  Status: Abnormal   Collection Time: 06/17/21  2:42 PM  Result Value Ref Range   Color, Urine YELLOW YELLOW   APPearance CLEAR CLEAR   Specific Gravity, Urine >=1.030 1.005 - 1.030   pH 6.0 5.0 - 8.0   Glucose, UA NEGATIVE NEGATIVE mg/dL   Hgb urine dipstick NEGATIVE NEGATIVE   Bilirubin Urine SMALL (A) NEGATIVE   Ketones, ur >=80 (A) NEGATIVE mg/dL   Protein, ur 30 (A) NEGATIVE mg/dL   Nitrite NEGATIVE NEGATIVE   Leukocytes,Ua SMALL (A) NEGATIVE    Comment: Performed at [facility], [address] [place], [place] [address]  Urinalysis, Microscopic (reflex)     Status: Abnormal   Collection Time: 06/17/21  2:42 PM  Result Value Ref Range   RBC / HPF 0-5 0 - 5 RBC/hpf   WBC, UA 6-10 0 - 5 WBC/hpf   Bacteria, UA MANY  (A) NONE SEEN   Squamous Epithelial / LPF 6-10 0 - 5   Mucus PRESENT     Comment: Performed at [facility], [address] [place], [place] [address]  Urine Culture     Status: Abnormal   Collection Time: 06/17/21  3:28 PM   Specimen: Urine, Random  Result Value Ref Range   Specimen Description      URINE, RANDOM Performed at [facility], [address] [place], [place] [address]    Special Requests      NONE Performed at [facility], [address] [place], [place] [address]    Culture MULTIPLE SPECIES PRESENT, SUGGEST RECOLLECTION (A)    Report Status 06/19/2021 FINAL   Resp Panel by RT-PCR (Flu A&B, Covid) Nasopharyngeal Swab     Status: None   Collection Time: 06/17/21  3:46 PM   Specimen: Nasopharyngeal Swab; Nasopharyngeal(NP) swabs in vial transport medium  Result Value Ref Range   SARS Coronavirus 2 by RT PCR NEGATIVE NEGATIVE    Comment: (NOTE) SARS-CoV-2 target nucleic acids are NOT DETECTED.  The SARS-CoV-2 RNA is generally detectable in upper respiratory specimens during the acute phase of infection. The lowest concentration of SARS-CoV-2 viral copies this assay can detect is 138 copies/mL. A negative result does not preclude SARS-Cov-2 infection and should not be used as the sole basis for treatment or other patient management decisions. A negative result may occur with  improper specimen collection/handling, submission of specimen other than nasopharyngeal swab, presence of viral mutation(s) within the areas targeted by this assay, and inadequate number of viral copies(<138 copies/mL). A negative result must be combined with clinical observations, patient history, and epidemiological information. The expected result is Negative.  Fact Sheet for Patients:  BloggerCourse.com  Fact Sheet for Healthcare Providers:  SeriousBroker.it  This test is no t yet approved or cleared  by the [place] FDA and  has been authorized for detection and/or diagnosis of SARS-CoV-2 by FDA under an Emergency Use Authorization (EUA). This EUA will remain  in effect (meaning this test can be used) for the duration of the COVID-19 declaration under Section 564(b)(1) of the Act, 21 U.S.C.section 360bbb-3(b)(1), unless the authorization is terminated  or revoked sooner.       Influenza A by PCR NEGATIVE NEGATIVE   Influenza B by PCR NEGATIVE NEGATIVE    Comment: (NOTE) The Xpert Xpress SARS-CoV-2/FLU/RSV plus assay is intended as an aid in the diagnosis of influenza from Nasopharyngeal swab specimens and should not be used as a sole basis  for treatment. Nasal washings and aspirates are unacceptable for Xpert Xpress SARS-CoV-2/FLU/RSV testing.  Fact Sheet for Patients: BloggerCourse.com  Fact Sheet for Healthcare Providers: SeriousBroker.it  This test is not yet approved or cleared by the [place] FDA and has been authorized for detection and/or diagnosis of SARS-CoV-2 by FDA under an Emergency Use Authorization (EUA). This EUA will remain in effect (meaning this test can be used) for the duration of the COVID-19 declaration under Section 564(b)(1) of the Act, 21 U.S.C. section 360bbb-3(b)(1), unless the authorization is terminated or revoked.  Performed at [facility], [address] [place], [place] [address]   Comprehensive metabolic panel     Status: Abnormal   Collection Time: 06/18/21  4:48 AM  Result Value Ref Range   Sodium 135 135 - 145 mmol/L   Potassium 3.0 (L) 3.5 - 5.1 mmol/L   Chloride 105 98 - 111 mmol/L   CO2 23 22 - 32 mmol/L   Glucose, Bld 82 70 - 99 mg/dL    Comment: Glucose reference range applies only to samples taken after fasting for at least 8 hours.   BUN 8 6 - 20 mg/dL   Creatinine, Ser 6.21 0.44 - 1.00 mg/dL   Calcium 8.3 (L) 8.9 - 10.3 mg/dL   Total Protein 5.3 (L)  6.5 - 8.1 g/dL   Albumin 2.9 (L) 3.5 - 5.0 g/dL   AST 13 (L) 15 - 41 U/L   ALT 10 0 - 44 U/L   Alkaline Phosphatase 24 (L) 38 - 126 U/L   Total Bilirubin 1.4 (H) 0.3 - 1.2 mg/dL   GFR, Estimated >30 >86 mL/min    Comment: (NOTE) Calculated using the CKD-EPI Creatinine Equation (2021)    Anion gap 7 5 - 15    Comment: Performed at [facility], [address] [place], [place] [address]  Magnesium     Status: Abnormal   Collection Time: 06/18/21  4:48 AM  Result Value Ref Range   Magnesium 1.5 (L) 1.7 - 2.4 mg/dL    Comment: Performed at [facility], [address] [place], [place] [address]  TSH     Status: Abnormal   Collection Time: 06/18/21  4:48 AM  Result Value Ref Range   TSH 0.011 (L) 0.350 - 4.500 uIU/mL    Comment: Performed by a 3rd Generation assay with a functional sensitivity of <=0.01 uIU/mL. Performed at [facility], [address], [place], [place] [address]   Comprehensive metabolic panel     Status: Abnormal   Collection Time: 06/19/21  8:08 AM  Result Value Ref Range   Sodium 134 (L) 135 - 145 mmol/L   Potassium 3.7 3.5 - 5.1 mmol/L   Chloride 105 98 - 111 mmol/L   CO2 23 22 - 32 mmol/L   Glucose, Bld 151 (H) 70 - 99 mg/dL    Comment: Glucose reference range applies only to samples taken after fasting for at least 8 hours.   BUN <5 (L) 6 - 20 mg/dL   Creatinine, Ser 2.44 0.44 - 1.00 mg/dL   Calcium 9.1 8.9 - 01.0 mg/dL   Total Protein 5.7 (L) 6.5 - 8.1 g/dL   Albumin 3.0 (L) 3.5 - 5.0 g/dL   AST 17 15 - 41 U/L   ALT 8 0 - 44 U/L   Alkaline Phosphatase 24 (L) 38 - 126 U/L   Total Bilirubin 1.0 0.3 - 1.2 mg/dL   GFR, Estimated >27 >  60 mL/min    Comment: (NOTE) Calculated using the CKD-EPI Creatinine Equation (2021)    Anion gap 6 5 - 15    Comment: Performed at [facility], [address], [place], [place] [address]  Magnesium     Status: Abnormal   Collection Time: 06/19/21  8:08 AM  Result Value Ref Range   Magnesium  1.4 (L) 1.7 - 2.4 mg/dL    Comment: Performed at [facility], [address] [place], [place] [address]  T4, free     Status: Abnormal   Collection Time: 06/19/21  8:08 AM  Result Value Ref Range   Free T4 1.76 (H) 0.61 - 1.12 ng/dL    Comment: (NOTE) Biotin ingestion may interfere with free T4 tests. If the results are inconsistent with the TSH level, previous test results, or the clinical presentation, then consider biotin interference. If needed, order repeat testing after stopping biotin. Performed at [facility], [address], [place], [place] [address]     I have reviewed the patient's current medications.  ASSESSMENT: Principal Problem:   Hyperemesis gravidarum   PLAN: Pt seen and is improving with IV steroids, last dose tonight. Will transition to oral prednisone on 06/20/21 for 2 weeks. Magnesium replaced today. See Dr. Forestine Chute note for dosing schedule. Anticipate d/c on 12/5/ if still tolerating  po and oral medication.   Continue routine antenatal care.   Mariel Aloe, MD [facility] Faculty Attending, Center for [facility] 06/19/2021 11:24 AM

## 2021-06-19 NOTE — Progress Notes (Signed)
Patient able to eat 50% of plain bagel without nausea and vomiting.

## 2021-06-20 DIAGNOSIS — E876 Hypokalemia: Secondary | ICD-10-CM

## 2021-06-20 DIAGNOSIS — O99281 Endocrine, nutritional and metabolic diseases complicating pregnancy, first trimester: Secondary | ICD-10-CM | POA: Diagnosis present

## 2021-06-20 DIAGNOSIS — E43 Unspecified severe protein-calorie malnutrition: Secondary | ICD-10-CM | POA: Diagnosis present

## 2021-06-20 LAB — T3, FREE: T3, Free: 2.6 pg/mL (ref 2.0–4.4)

## 2021-06-20 LAB — MAGNESIUM: Magnesium: 1.4 mg/dL — ABNORMAL LOW (ref 1.7–2.4)

## 2021-06-20 MED ORDER — SODIUM CHLORIDE 0.9 % IV SOLN
8.0000 mg | Freq: Three times a day (TID) | INTRAVENOUS | Status: DC
  Filled 2021-06-20 (×5): qty 4

## 2021-06-20 MED ORDER — ONDANSETRON 4 MG PO TBDP
4.0000 mg | ORAL_TABLET | Freq: Three times a day (TID) | ORAL | Status: DC
  Administered 2021-06-20 – 2021-06-21 (×2): 4 mg via ORAL
  Filled 2021-06-20 (×2): qty 1

## 2021-06-20 MED ORDER — MAGNESIUM SULFATE 2 GM/50ML IV SOLN
2.0000 g | Freq: Once | INTRAVENOUS | Status: AC
  Administered 2021-06-20: 2 g via INTRAVENOUS
  Filled 2021-06-20: qty 50

## 2021-06-20 MED ORDER — SCOPOLAMINE 1 MG/3DAYS TD PT72
1.0000 | MEDICATED_PATCH | TRANSDERMAL | Status: DC
  Administered 2021-06-20: 1.5 mg via TRANSDERMAL
  Filled 2021-06-20: qty 1

## 2021-06-20 MED ORDER — ONDANSETRON HCL 4 MG/2ML IJ SOLN
4.0000 mg | Freq: Three times a day (TID) | INTRAMUSCULAR | Status: DC
  Administered 2021-06-20: 4 mg via INTRAVENOUS
  Filled 2021-06-20: qty 2

## 2021-06-20 NOTE — Progress Notes (Signed)
Patient ID: ERNISHA SORN, female   DOB: Aug 14, 1995, 25 y.o.   MRN: [id number]   Assessment/Plan: Principal Problem:   Hyperemesis gravidarum Active Problems:   Hyperthyroidism affecting pregnancy in first trimester   Hypomagnesemia   Hypokalemia   Protein-calorie malnutrition, severe (HCC)  Hyperemesis gravidarum, worsening  Continue steroids though she is likely threw these up today Add scopolamine Continue Robinul Change Zofran from as needed to scheduled Continue Reglan Magnesium and potassium repletion Daily weights, weight has dropped a little bit today  Ptyalism Continue Robinul  Probable hyperemesis associated hyperthyroidism, new-the patient denies any significant weight loss tachycardia or other findings of overt hyperthyroidism prior to pregnancy.  She does not appear to have gestational trophoblastic disease at this point we will continue to monitor.  Severe protein calorie malnutrition  As evidenced by 13% weight loss, loss of subcuticular fat  Subjective: Interval History: Patient reports attempting to eat a bagel last night which she promptly threw up.  She has also tried a couple of other solid foods which she has continued to throw up.  This morning after taking her p.o. meds she threw those up as well.  She feels some better than she did when she got admitted but she continues to throw up and have spitting.  Objective: Vital signs in last 24 hours: Temp:  [98 F (36.7 C)-99 F (37.2 C)] 98.2 F (36.8 C) (12/05 1148) Pulse Rate:  [57-79] 57 (12/05 1148) Resp:  [17-18] 18 (12/04 1944) BP: (107-114)/(61-81) 114/69 (12/05 1148) SpO2:  [100 %] 100 % (12/05 1148) Weight:  [52.6 kg] 52.6 kg (12/05 0515)  Intake/Output from previous day: 12/04 0701 - 12/05 0700 In: 3138.9 [P.O.:390; I.V.:2748.9] Out: 3000 [Urine:2950] Intake/Output this shift: Total I/O In: 1392.5 [P.O.:480; I.V.:862.5; IV Piggyback:50] Out: 725 [Urine:600; Emesis/NG  output:125]  General appearance: alert, cooperative, appears stated age, and cachectic Head: Normocephalic, without obvious abnormality, atraumatic Neck: supple, symmetrical, trachea midline Lungs: Normal effort Heart: regular rate and rhythm Abdomen: soft, non-tender; bowel sounds normal; no masses,  no organomegaly Extremities: extremities normal, atraumatic, no cyanosis or edema Skin: Skin color, texture, turgor normal. No rashes or lesions Neurologic: Grossly normal  Results for orders placed or performed during the hospital encounter of 06/17/21 (from the past 24 hour(s))  Magnesium     Status: Abnormal   Collection Time: 06/20/21  4:48 AM  Result Value Ref Range   Magnesium 1.4 (L) 1.7 - 2.4 mg/dL    Studies/Results: US OB Transvaginal  Result Date: 06/18/2021 CLINICAL DATA:  Hyperemesis EXAM: OBSTETRIC <14 WK [place] TECHNIQUE: Both transabdominal and transvaginal ultrasound examinations were performed for complete evaluation of the gestation as well as the maternal uterus, adnexal regions, and pelvic cul-de-sac. Transvaginal technique was performed to assess early pregnancy. COMPARISON:  None. FINDINGS: Intrauterine gestational sac: Single Yolk sac:  Visualized. Embryo:  Visualized. Cardiac Activity: Visualized. Heart Rate: 159 bpm CRL:  27.5 mm   9 w   4 d                  [place] EDC: 01/17/2022 Subchorionic hemorrhage:  None visualized. Maternal uterus/adnexae: Normal. IMPRESSION: Single intrauterine pregnancy corresponding to 9 weeks and 4 days gestation. No complicating features seen. Electronically Signed   By: Ted Mcalpine M.D.   On: 06/18/2021 16:57    Scheduled Meds:  famotidine  20 mg Oral Q12H   folic acid  1 mg Oral Daily   Or   folic acid  1 mg Intravenous Daily  glycopyrrolate  2 mg Oral TID   metoCLOPramide  10 mg Oral Q6H   Or   metoCLOPramide (REGLAN) injection  10 mg Intravenous Q6H   ondansetron  4-8 mg Oral Q8H   Or   ondansetron (ZOFRAN) IV  4 mg  Intravenous Q8H   predniSONE  40 mg Oral Q breakfast   scopolamine  1 patch Transdermal Q72H   thiamine  100 mg Oral Daily   Or   thiamine  100 mg Intravenous Daily   Continuous Infusions:  dextrose 5% lactated ringers with KCl 20 mEq/L 125 mL/hr at 06/20/21 1023   famotidine (PEPCID) IV 20 mg (06/18/21 2142)   ondansetron (ZOFRAN) IV     PRN Meds:hydrOXYzine **OR** hydrOXYzine, promethazine **OR** promethazine    LOS: 3 days   Reva Bores, MD 06/20/2021 3:38 PM

## 2021-06-21 ENCOUNTER — Telehealth: Payer: Medicaid Other

## 2021-06-21 MED ORDER — GLYCOPYRROLATE 1 MG PO TABS: 2.0000 mg | ORAL_TABLET | Freq: Three times a day (TID) | ORAL | 0 refills | Status: DC

## 2021-06-21 MED ORDER — SCOPOLAMINE 1 MG/3DAYS TD PT72: 1.0000 | MEDICATED_PATCH | TRANSDERMAL | 12 refills | Status: DC

## 2021-06-21 MED ORDER — METOCLOPRAMIDE HCL 10 MG PO TABS: 10.0000 mg | ORAL_TABLET | Freq: Four times a day (QID) | ORAL | 1 refills | Status: DC

## 2021-06-21 MED ORDER — PREDNISONE 20 MG PO TABS: 40.0000 mg | ORAL_TABLET | Freq: Every day | ORAL | 0 refills | Status: DC

## 2021-06-21 MED ORDER — ONDANSETRON 4 MG PO TBDP: 4.0000 mg | ORAL_TABLET | Freq: Four times a day (QID) | ORAL | Status: DC | PRN

## 2021-06-21 MED ORDER — ONDANSETRON 4 MG PO TBDP
4.0000 mg | ORAL_TABLET | Freq: Four times a day (QID) | ORAL | 2 refills | Status: DC | PRN
Start: 2021-06-21 — End: 2021-07-05

## 2021-06-21 NOTE — Discharge Summary (Addendum)
Patient ID: Patricia Craig MRN: [id number] DOB/AGE: 08-Feb-1996 25 y.o.  Admit date: 06/17/2021 Discharge date: 06/21/2021  Admission Diagnoses: IUP 9 weeks, Hyperemesis  Discharge Diagnoses: SAA, undelivered  Prenatal Procedures: none    Hospital Course:  This is a 25 y.o. G2P1001 with IUP at [redacted]w[redacted]d admitted for the above Dx. She was started on IV fluids and antiemetic medications. She slowly responded to treatment. Progressed to tolerating regular diet without N/V.     She was deemed stable for discharge to home with outpatient follow up.  Discharge Exam: Temp:  [98.2 F (36.8 C)-98.7 F (37.1 C)] 98.5 F (36.9 C) (12/06 0737) Pulse Rate:  [57-77] 70 (12/06 0737) Resp:  [16-18] 18 (12/06 0737) BP: (104-114)/(60-81) 108/66 (12/06 0737) SpO2:  [98 %-100 %] 98 % (12/06 0737) Weight:  [53.5 kg] 53.5 kg (12/06 3832) Physical Examination: Lungs clear Heart RRR Abd soft + BS Ext non tender   Significant Diagnostic Studies:  Results for orders placed or performed during the hospital encounter of 06/17/21 (from the past 168 hour(s))  CBC   Collection Time: 06/17/21 12:25 PM  Result Value Ref Range   WBC 8.3 4.0 - 10.5 K/uL   RBC 4.43 3.87 - 5.11 MIL/uL   Hemoglobin 11.4 (L) 12.0 - 15.0 g/dL   HCT 91.9 (L) 16.6 - 06.0 %   MCV 80.4 80.0 - 100.0 fL   MCH 25.7 (L) 26.0 - 34.0 pg   MCHC 32.0 30.0 - 36.0 g/dL   RDW 04.5 (H) 99.7 - 74.1 %   Platelets 169 150 - 400 K/uL   nRBC 0.0 0.0 - 0.2 %  Comprehensive metabolic panel   Collection Time: 06/17/21 12:25 PM  Result Value Ref Range   Sodium 138 135 - 145 mmol/L   Potassium 3.5 3.5 - 5.1 mmol/L   Chloride 104 98 - 111 mmol/L   CO2 18 (L) 22 - 32 mmol/L   Glucose, Bld 78 70 - 99 mg/dL   BUN 16 6 - 20 mg/dL   Creatinine, Ser 4.23 0.44 - 1.00 mg/dL   Calcium 9.3 8.9 - 95.3 mg/dL   Total Protein 7.8 6.5 - 8.1 g/dL   Albumin 4.4 3.5 - 5.0 g/dL   AST 17 15 - 41 U/L   ALT 11 0 - 44 U/L   Alkaline Phosphatase 34 (L) 38 - 126 U/L    Total Bilirubin 1.5 (H) 0.3 - 1.2 mg/dL   GFR, Estimated >20 >23 mL/min   Anion gap 16 (H) 5 - 15  Urinalysis, Routine w reflex microscopic Urine, Clean Catch   Collection Time: 06/17/21  2:42 PM  Result Value Ref Range   Color, Urine YELLOW YELLOW   APPearance CLEAR CLEAR   Specific Gravity, Urine >=1.030 1.005 - 1.030   pH 6.0 5.0 - 8.0   Glucose, UA NEGATIVE NEGATIVE mg/dL   Hgb urine dipstick NEGATIVE NEGATIVE   Bilirubin Urine SMALL (A) NEGATIVE   Ketones, ur >=80 (A) NEGATIVE mg/dL   Protein, ur 30 (A) NEGATIVE mg/dL   Nitrite NEGATIVE NEGATIVE   Leukocytes,Ua SMALL (A) NEGATIVE  Urinalysis, Microscopic (reflex)   Collection Time: 06/17/21  2:42 PM  Result Value Ref Range   RBC / HPF 0-5 0 - 5 RBC/hpf   WBC, UA 6-10 0 - 5 WBC/hpf   Bacteria, UA MANY (A) NONE SEEN   Squamous Epithelial / LPF 6-10 0 - 5   Mucus PRESENT   Urine Culture   Collection Time: 06/17/21  3:28 PM  Specimen: Urine, Random  Result Value Ref Range   Specimen Description      URINE, RANDOM Performed at [facility], [address] [place], [place] [address]    Special Requests      NONE Performed at [facility], [address] [place], [place] [address]    Culture MULTIPLE SPECIES PRESENT, SUGGEST RECOLLECTION (A)    Report Status 06/19/2021 FINAL   Resp Panel by RT-PCR (Flu A&B, Covid) Nasopharyngeal Swab   Collection Time: 06/17/21  3:46 PM   Specimen: Nasopharyngeal Swab; Nasopharyngeal(NP) swabs in vial transport medium  Result Value Ref Range   SARS Coronavirus 2 by RT PCR NEGATIVE NEGATIVE   Influenza A by PCR NEGATIVE NEGATIVE   Influenza B by PCR NEGATIVE NEGATIVE  Comprehensive metabolic panel   Collection Time: 06/18/21  4:48 AM  Result Value Ref Range   Sodium 135 135 - 145 mmol/L   Potassium 3.0 (L) 3.5 - 5.1 mmol/L   Chloride 105 98 - 111 mmol/L   CO2 23 22 - 32 mmol/L   Glucose, Bld 82 70 - 99 mg/dL   BUN 8 6 - 20 mg/dL   Creatinine, Ser 8.29  0.44 - 1.00 mg/dL   Calcium 8.3 (L) 8.9 - 10.3 mg/dL   Total Protein 5.3 (L) 6.5 - 8.1 g/dL   Albumin 2.9 (L) 3.5 - 5.0 g/dL   AST 13 (L) 15 - 41 U/L   ALT 10 0 - 44 U/L   Alkaline Phosphatase 24 (L) 38 - 126 U/L   Total Bilirubin 1.4 (H) 0.3 - 1.2 mg/dL   GFR, Estimated >93 >71 mL/min   Anion gap 7 5 - 15  Magnesium   Collection Time: 06/18/21  4:48 AM  Result Value Ref Range   Magnesium 1.5 (L) 1.7 - 2.4 mg/dL  TSH   Collection Time: 06/18/21  4:48 AM  Result Value Ref Range   TSH 0.011 (L) 0.350 - 4.500 uIU/mL  Comprehensive metabolic panel   Collection Time: 06/19/21  8:08 AM  Result Value Ref Range   Sodium 134 (L) 135 - 145 mmol/L   Potassium 3.7 3.5 - 5.1 mmol/L   Chloride 105 98 - 111 mmol/L   CO2 23 22 - 32 mmol/L   Glucose, Bld 151 (H) 70 - 99 mg/dL   BUN <5 (L) 6 - 20 mg/dL   Creatinine, Ser 6.96 0.44 - 1.00 mg/dL   Calcium 9.1 8.9 - 78.9 mg/dL   Total Protein 5.7 (L) 6.5 - 8.1 g/dL   Albumin 3.0 (L) 3.5 - 5.0 g/dL   AST 17 15 - 41 U/L   ALT 8 0 - 44 U/L   Alkaline Phosphatase 24 (L) 38 - 126 U/L   Total Bilirubin 1.0 0.3 - 1.2 mg/dL   GFR, Estimated >38 >10 mL/min   Anion gap 6 5 - 15  Magnesium   Collection Time: 06/19/21  8:08 AM  Result Value Ref Range   Magnesium 1.4 (L) 1.7 - 2.4 mg/dL  T3, free   Collection Time: 06/19/21  8:08 AM  Result Value Ref Range   T3, Free 2.6 2.0 - 4.4 pg/mL  T4, free   Collection Time: 06/19/21  8:08 AM  Result Value Ref Range   Free T4 1.76 (H) 0.61 - 1.12 ng/dL  Magnesium   Collection Time: 06/20/21  4:48 AM  Result Value Ref Range   Magnesium 1.4 (L) 1.7 - 2.4 mg/dL  Results for orders placed  or performed during the hospital encounter of 06/15/21 (from the past 168 hour(s))  CBC with Differential   Collection Time: 06/15/21  9:59 AM  Result Value Ref Range   WBC 8.2 4.0 - 10.5 K/uL   RBC 4.78 3.87 - 5.11 MIL/uL   Hemoglobin 12.4 12.0 - 15.0 g/dL   HCT 96.2 22.9 - 79.8 %   MCV 78.5 (L) 80.0 - 100.0 fL   MCH  25.9 (L) 26.0 - 34.0 pg   MCHC 33.1 30.0 - 36.0 g/dL   RDW 92.1 (H) 19.4 - 17.4 %   Platelets 181 150 - 400 K/uL   nRBC 0.0 0.0 - 0.2 %   Neutrophils Relative % 84 %   Neutro Abs 6.9 1.7 - 7.7 K/uL   Lymphocytes Relative 9 %   Lymphs Abs 0.7 0.7 - 4.0 K/uL   Monocytes Relative 6 %   Monocytes Absolute 0.5 0.1 - 1.0 K/uL   Eosinophils Relative 0 %   Eosinophils Absolute 0.0 0.0 - 0.5 K/uL   Basophils Relative 1 %   Basophils Absolute 0.0 0.0 - 0.1 K/uL   Immature Granulocytes 0 %   Abs Immature Granulocytes 0.01 0.00 - 0.07 K/uL  Basic metabolic panel   Collection Time: 06/15/21  9:59 AM  Result Value Ref Range   Sodium 135 135 - 145 mmol/L   Potassium 3.2 (L) 3.5 - 5.1 mmol/L   Chloride 97 (L) 98 - 111 mmol/L   CO2 21 (L) 22 - 32 mmol/L   Glucose, Bld 94 70 - 99 mg/dL   BUN 23 (H) 6 - 20 mg/dL   Creatinine, Ser 0.81 0.44 - 1.00 mg/dL   Calcium 9.7 8.9 - 44.8 mg/dL   GFR, Estimated >18 >56 mL/min   Anion gap 17 (H) 5 - 15  Magnesium   Collection Time: 06/15/21  9:59 AM  Result Value Ref Range   Magnesium 2.2 1.7 - 2.4 mg/dL    Discharge Condition: Stable  Disposition: Discharge disposition: 01-Home or Self Care       Discharge Instructions     Discharge activity:  No Restrictions   Complete by: As directed    Discharge diet:  No restrictions   Complete by: As directed    No sexual activity restrictions   Complete by: As directed       Allergies as of 06/21/2021   No Known Allergies      Medication List     TAKE these medications    dicyclomine 20 MG tablet Commonly known as: BENTYL Take 1 tablet (20 mg total) by mouth 2 (two) times daily.   Doxylamine-Pyridoxine 10-10 MG Tbec Take 2 tabs at bedtime. If symptoms are controlled, continue taking 2 tabs at bedtime. If symptoms persist, take 2 tabs at bedtime, then 1 tab in the morning of Day 3 and 2 tabs at bedtime. If symptoms are controlled on Day 4, continue as scheduled. If symptoms are not  controlled, increase dose to 1 tab in the morning, 1 tab midafternoon, and 2 tabs in the evening. Take as scheduled and not on an as needed basis. Max: 4 tabs daily.   famotidine 20 MG tablet Commonly known as: Pepcid Take 1 tablet (20 mg total) by mouth 2 (two) times daily.   glycopyrrolate 1 MG tablet Commonly known as: ROBINUL Take 2 tablets (2 mg total) by mouth 3 (three) times daily.   metoCLOPramide 10 MG tablet Commonly known as: REGLAN Take 1 tablet (10 mg total) by mouth  every 6 (six) hours.   ondansetron 4 MG disintegrating tablet Commonly known as: Zofran ODT  ODT q4 hours prn nausea/vomit What changed: Another medication with the same name was added. Make sure you understand how and when to take each.   ondansetron 4 MG disintegrating tablet Commonly known as: ZOFRAN-ODT Take 1 tablet (4 mg total) by mouth every 6 (six) hours as needed for nausea or vomiting. What changed: You were already taking a medication with the same name, and this prescription was added. Make sure you understand how and when to take each.   ondansetron 4 MG tablet Commonly known as: ZOFRAN Take 1 tablet (4 mg total) by mouth every 8 (eight) hours as needed for nausea or vomiting.   predniSONE 20 MG tablet Commonly known as: DELTASONE Take 2 tablets (40 mg total) by mouth daily with breakfast.   prenatal multivitamin Tabs tablet Take 1 tablet by mouth daily at 12 noon.   pyridOXINE 100 MG tablet Commonly known as: VITAMIN B-6 Take 100 mg by mouth daily.   scopolamine 1 MG/3DAYS Commonly known as: TRANSDERM-SCOP Place 1 patch (1.5 mg total) onto the skin every 3 (three) days. Start taking on: June 23, 2021        Follow-up Information     [facility]. Schedule an appointment as soon as possible for a visit.   Specialty: Obstetrics and Gynecology Contact information: [address] [place]  [id number] [phone]                Signed: Hermina Staggers M.D. 06/21/2021, 8:58 AM

## 2021-06-28 ENCOUNTER — Telehealth (INDEPENDENT_AMBULATORY_CARE_PROVIDER_SITE_OTHER): Payer: Self-pay | Admitting: *Deleted

## 2021-06-28 ENCOUNTER — Other Ambulatory Visit: Payer: Self-pay

## 2021-06-28 DIAGNOSIS — O99281 Endocrine, nutritional and metabolic diseases complicating pregnancy, first trimester: Secondary | ICD-10-CM

## 2021-06-28 DIAGNOSIS — E876 Hypokalemia: Secondary | ICD-10-CM

## 2021-06-28 DIAGNOSIS — A6000 Herpesviral infection of urogenital system, unspecified: Secondary | ICD-10-CM

## 2021-06-28 DIAGNOSIS — O09899 Supervision of other high risk pregnancies, unspecified trimester: Secondary | ICD-10-CM

## 2021-06-28 DIAGNOSIS — O210 Mild hyperemesis gravidarum: Secondary | ICD-10-CM

## 2021-06-28 DIAGNOSIS — O0990 Supervision of high risk pregnancy, unspecified, unspecified trimester: Secondary | ICD-10-CM

## 2021-06-28 DIAGNOSIS — R1110 Vomiting, unspecified: Secondary | ICD-10-CM

## 2021-06-28 DIAGNOSIS — E0590 Thyrotoxicosis, unspecified without thyrotoxic crisis or storm: Secondary | ICD-10-CM

## 2021-06-28 DIAGNOSIS — E43 Unspecified severe protein-calorie malnutrition: Secondary | ICD-10-CM

## 2021-06-28 MED ORDER — BLOOD PRESSURE KIT DEVI: 1.0000 | 0 refills | Status: DC | PRN

## 2021-06-28 MED ORDER — GOJJI WEIGHT SCALE MISC: 1.0000 | 0 refills | Status: DC | PRN

## 2021-06-28 NOTE — Progress Notes (Deleted)
Dating Criteria: Methods for Estimating the Due Date      Do not use MSD!!!  No pulse wave in first trimester.  

## 2021-06-28 NOTE — Patient Instructions (Signed)
  At our [facility], we work as an integrated team, providing care to address both physical and emotional health. Your medical provider may refer you to see our Behavioral Health Clinician (BHC) on the same day you see your medical provider, as availability permits; often scheduled virtually at your convenience.  Our BHC is available to all patients, visits generally last between 20-30 minutes, but can be longer or shorter, depending on patient need. The BHC offers help with stress management, coping with symptoms of depression and anxiety, major life changes , sleep issues, changing risky behavior, grief and loss, life stress, working on personal life goals, and  behavioral health issues, as these all affect your overall health and wellness.  The BHC is NOT available for the following: FMLA paperwork, court-ordered evaluations, specialty assessments (custody or disability), letters to employers, or obtaining certification for an emotional support animal. The BHC does not provide long-term therapy. You have the right to refuse integrated behavioral health services, or to reschedule to see the BHC at a later date.  Confidentiality exception: If it is suspected that a child or disabled adult is being abused or neglected, we are required by law to report that to either Child Protective Services or Adult Protective Services.  If you have a diagnosis of Bipolar affective disorder, Schizophrenia, or recurrent Major depressive disorder, we will recommend that you establish care with a psychiatrist, as these are lifelong, chronic conditions, and we want your overall emotional health and medications to be more closely monitored. If you anticipate needing extended maternity leave due to mental health issues postpartum, it it recommended you inform your medical provider, so we can put in a referral to a psychiatrist as soon as possible. The BHC is unable to recommend an extended maternity leave for mental  health issues. Your medical provider or BHC may refer you to a therapist for ongoing, traditional therapy, or to a psychiatrist, for medication management, if it would benefit your overall health. Depending on your insurance, you may have a copay or be charged a deductible, depending on your insurance, to see the BHC. If you are uninsured, it is recommended that you apply for financial assistance. (Forms may be requested at the front desk for in-person visits, via MyChart, or request a form during a virtual visit).  If you see the BHC more than 6 times, you will have to complete a comprehensive clinical assessment interview with the BHC to resume integrated services.  For virtual visits with the BHC, you must be physically in the state of [place] at the time of the visit. For example, if you live in [place], you will have to do an in-person visit with the BHC, and your out-of-state insurance may not cover behavioral health services in [place]. If you are going out of the state or country for any reason, the BHC may see you virtually when you return to [place], but not while you are physically outside of [place].    [email or web address] is a website with information to help you prepare for Labor and delivery, patient information, visitor information and more.    Here is a link to the Pregnancy Navigators . Please Fill out prior to your New OB appointment.   English Link: [email or web address]  Spanish Link: [email or web address]  

## 2021-06-28 NOTE — Progress Notes (Addendum)
Patient not on MyChart I called and asked her if she can join virtually, which she said she will. Patricia Schleifer,RN    New OB Intake  I connected with  Patricia Craig on 06/28/21 at  1:15 PM EST by MyChart Video Visit and verified that I am speaking with the correct person using two identifiers. Nurse is located at [facility] and pt is located at home.  I discussed the limitations, risks, security and privacy concerns of performing an evaluation and management service by telephone and the availability of in person appointments. I also discussed with the patient that there may be a patient responsible charge related to this service. The patient expressed understanding and agreed to proceed.  I explained I am completing New OB Intake today. We discussed her EDD of 01/23/22 that is based on LMP of 04/18/21. Pt is G2/P1001. I reviewed her allergies, medications, Medical/Surgical/OB history, and appropriate screenings. I informed her of [facility] services. Offered referral based on screening, patient declines. Based on history, this is a/an  pregnancy complicated by Hyperemeis  .   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   MyChart/Babyscripts MyChart access verified. I explained pt will have some visits in office and some virtually. Babyscripts instructions given and order placed. Patient verifies receipt of registration text/e-mail.   Blood Pressure Cuff  Blood pressure cuff ordered for patient to pick-up from [employer]. Explained after first prenatal appt pt will check weekly and document in  Babyscripts.  Weight scale: Patient does not have weight scale. Weight scale ordered for patient to pick up from [employer].   Anatomy US Explained first scheduled [place] will be around 19 weeks. Anatomy US will be scheduled and patient notified by MyChart..  Labs Discussed Avelina Laine genetic screening with patient. Would like Panorama drawn at new OB visit. Routine prenatal labs needed.  Covid Vaccine Patient has not had the covid vaccine.   Centering in Pregnancy Candidate?  If yes, offer as possibility. Patient declines  Mother/ Baby Dyad Candidate?    If yes, offer as possibility. Not a candidate due to 2nd pregnancy.  Informed patient of [facility] website  and placed link in her AVS.   Social Determinants of Health Food Insecurity: Patient denies food insecurity. WIC Referral: Patient is interested in referral to [facility].  Transportation: Patient denies transportation needs. Childcare: Discussed no children allowed at ultrasound appointments. Offered childcare services; patient declines childcare services at this time.  Send link to Pregnancy Navigators   Placed OB Box on problem list and updated  First visit review I reviewed new OB appt with pt. I explained she will have a pelvic exam, ob bloodwork with genetic screening, and PAP smear. Explained pt will be seen by Luna Kitchens, CNM at first visit; encounter routed to appropriate provider. Explained that patient will be seen by pregnancy navigator following visit with provider. [facility] information placed in AVS.   Patricia Vien,RN 06/28/2021  1:55 PM

## 2021-06-30 ENCOUNTER — Encounter (HOSPITAL_BASED_OUTPATIENT_CLINIC_OR_DEPARTMENT_OTHER): Payer: Self-pay

## 2021-06-30 ENCOUNTER — Other Ambulatory Visit (HOSPITAL_BASED_OUTPATIENT_CLINIC_OR_DEPARTMENT_OTHER): Payer: Self-pay

## 2021-06-30 ENCOUNTER — Emergency Department (HOSPITAL_BASED_OUTPATIENT_CLINIC_OR_DEPARTMENT_OTHER)
Admission: EM | Admit: 2021-06-30 | Discharge: 2021-06-30 | Disposition: A | Discharge status: Home / Self Care | Payer: Medicaid Other | Attending: Emergency Medicine | Admitting: Emergency Medicine

## 2021-06-30 ENCOUNTER — Other Ambulatory Visit: Payer: Self-pay

## 2021-06-30 DIAGNOSIS — Z3A10 10 weeks gestation of pregnancy: Secondary | ICD-10-CM | POA: Diagnosis not present

## 2021-06-30 DIAGNOSIS — O210 Mild hyperemesis gravidarum: Secondary | ICD-10-CM | POA: Diagnosis present

## 2021-06-30 DIAGNOSIS — R000 Tachycardia, unspecified: Secondary | ICD-10-CM | POA: Diagnosis not present

## 2021-06-30 LAB — COMPREHENSIVE METABOLIC PANEL
ALT: 9 U/L (ref 0–44)
AST: 17 U/L (ref 15–41)
Albumin: 4.2 g/dL (ref 3.5–5.0)
Alkaline Phosphatase: 34 U/L — ABNORMAL LOW (ref 38–126)
Anion gap: 15 (ref 5–15)
BUN: 19 mg/dL (ref 6–20)
CO2: 20 mmol/L — ABNORMAL LOW (ref 22–32)
Calcium: 9.3 mg/dL (ref 8.9–10.3)
Chloride: 100 mmol/L (ref 98–111)
Creatinine, Ser: 0.72 mg/dL (ref 0.44–1.00)
GFR, Estimated: >60 mL/min (ref >60)
Glucose, Bld: 77 mg/dL (ref 70–99)
Potassium: 3.5 mmol/L (ref 3.5–5.1)
Sodium: 135 mmol/L (ref 135–145)
Total Bilirubin: 2.1 mg/dL — ABNORMAL HIGH (ref 0.3–1.2)
Total Protein: 7.7 g/dL (ref 6.5–8.1)

## 2021-06-30 LAB — CBC WITH DIFFERENTIAL/PLATELET
Abs Immature Granulocytes: 0.03 10*3/uL (ref 0.00–0.07)
Basophils Absolute: 0 10*3/uL (ref 0.0–0.1)
Basophils Relative: 0 %
Eosinophils Absolute: 0 10*3/uL (ref 0.0–0.5)
Eosinophils Relative: 0 %
HCT: 37.1 % (ref 36.0–46.0)
Hemoglobin: 12.5 g/dL (ref 12.0–15.0)
Immature Granulocytes: 0 %
Lymphocytes Relative: 13 %
Lymphs Abs: 1.3 10*3/uL (ref 0.7–4.0)
MCH: 26.4 pg (ref 26.0–34.0)
MCHC: 33.7 g/dL (ref 30.0–36.0)
MCV: 78.3 fL — ABNORMAL LOW (ref 80.0–100.0)
Monocytes Absolute: 0.7 10*3/uL (ref 0.1–1.0)
Monocytes Relative: 7 %
Neutro Abs: 8 10*3/uL — ABNORMAL HIGH (ref 1.7–7.7)
Neutrophils Relative %: 80 %
Platelets: 212 10*3/uL (ref 150–400)
RBC: 4.74 MIL/uL (ref 3.87–5.11)
RDW: 16.8 % — ABNORMAL HIGH (ref 11.5–15.5)
WBC: 10 10*3/uL (ref 4.0–10.5)
nRBC: 0 % (ref 0.0–0.2)

## 2021-06-30 LAB — MAGNESIUM: Magnesium: 1.9 mg/dL (ref 1.7–2.4)

## 2021-06-30 LAB — LIPASE, BLOOD: Lipase: 25 U/L (ref 11–51)

## 2021-06-30 MED ORDER — LACTATED RINGERS IV BOLUS
1000.0000 mL | Freq: Once | INTRAVENOUS | Status: AC
  Administered 2021-06-30: 1000 mL via INTRAVENOUS

## 2021-06-30 MED ORDER — ONDANSETRON HCL 4 MG/2ML IJ SOLN
4.0000 mg | Freq: Once | INTRAMUSCULAR | Status: AC
  Administered 2021-06-30: 4 mg via INTRAVENOUS
  Filled 2021-06-30: qty 2

## 2021-06-30 MED ORDER — METOCLOPRAMIDE HCL 10 MG PO TABS
10.0000 mg | ORAL_TABLET | Freq: Four times a day (QID) | ORAL | 1 refills | Status: DC
  Filled 2021-06-30: qty 30, 8d supply, fill #0

## 2021-06-30 MED ORDER — ONDANSETRON 8 MG PO TBDP
8.0000 mg | ORAL_TABLET | Freq: Three times a day (TID) | ORAL | 0 refills | Status: DC | PRN
  Filled 2021-06-30: qty 20, 7d supply, fill #0

## 2021-06-30 MED ORDER — DOXYLAMINE-PYRIDOXINE 10-10 MG PO TBEC
2.0000 | DELAYED_RELEASE_TABLET | Freq: Every day | ORAL | 1 refills | Status: AC
  Filled 2021-06-30: qty 60, 30d supply, fill #0

## 2021-06-30 MED ORDER — VITAMIN B-6 100 MG PO TABS
100.0000 mg | ORAL_TABLET | Freq: Every day | ORAL | 0 refills | Status: DC
  Filled 2021-06-30: qty 100, 90d supply, fill #0

## 2021-06-30 MED ORDER — SCOPOLAMINE 1 MG/3DAYS TD PT72
1.0000 | MEDICATED_PATCH | TRANSDERMAL | 12 refills | Status: DC
  Filled 2021-06-30: qty 10, 30d supply, fill #0

## 2021-06-30 MED ORDER — SODIUM CHLORIDE 0.9 % IV BOLUS
1000.0000 mL | Freq: Once | INTRAVENOUS | Status: AC
  Administered 2021-06-30: 1000 mL via INTRAVENOUS

## 2021-06-30 MED ORDER — GLYCOPYRROLATE 1 MG PO TABS: 1.0000 mg | ORAL_TABLET | Freq: Once | ORAL | Status: DC

## 2021-06-30 NOTE — ED Triage Notes (Signed)
Pt is [redacted] weeks pregnant, c/o vomiting x 2 days, unable to keep medication or food down. States hasnt had a BM in a month.

## 2021-06-30 NOTE — ED Notes (Signed)
AVS with prescriptions provided to and discussed with patient. Pt verbalizes understanding of discharge instructions and denies any questions or concerns at this time. Pt has ride home. Pt ambulated out of department independently with steady gait.  

## 2021-06-30 NOTE — Discharge Instructions (Addendum)
You were seen in the emergency department today for hyperemesis gravidarum.  While you were here we gave you some Zofran and fluids to replenish your dehydration.  Your electrolytes were all normal.  I spoke with OB/GYN who recommends increasing her Zofran to 8 mg every 8 hours.  Please continue to take all of your nausea and vomiting medications as prescribed.  Please continue to push fluids as you can tolerate.  If you are unable to tolerate fluids or food over the next 24 hours I recommend that you present to the maternal assessment unit at [facility].  I have provided the address for this in your discharge instructions.  By doing this you will be able to see the OB/GYN's directly who can take care of your hyperemesis and evaluate your pregnancy.

## 2021-06-30 NOTE — ED Notes (Signed)
Pt provided with Ginger Ale. Tolerating well.

## 2021-06-30 NOTE — ED Notes (Signed)
Pt provided with Ginger Ale.

## 2021-06-30 NOTE — ED Notes (Signed)
Pt provided with Ginger Ale per request.

## 2021-06-30 NOTE — ED Provider Notes (Signed)
[facility] Provider Note   CSN: [id number] Arrival date & time: 06/30/21  1034     History Chief Complaint  Patient presents with   Emesis During Pregnancy     Patricia Craig is a 25 y.o. female.  With past medical history of hyperemesis gravidarum who presents to the emergency department with nausea and vomiting.  Patient states for 2 days she has had continuous nausea and vomiting with inability to tolerate p.o. food or liquids.  She is on multiple medications for hyperemesis.  She denies any fevers, abdominal pain or cramping, vaginal bleeding or increased or new vaginal discharge.  She denies diarrhea.  She states that she has not had a bowel movement in 1 month.  Patient states she is [redacted] weeks pregnant.  Patient recently had admission from 06/17/2021-06/21/2021 for same.  At that time she was given IV fluids and antiemetic medications.  She was noted to have lost about 10% of body weight due to hyperemesis.  She was 55.8 kg on November 11, 54 kg on November 30, 49.6 kg on 12/2.  Initially patient was only on Zofran and B6.  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  She was eventually transitioned to p.o. prednisone.  She was also given scopolamine patches.  On discharge, OB/GYN team started patient on on Bentyl and Diclegis as well.  At discharge she was 53.5 kg  She states that she has been taking all of her home medications as prescribed.  Last documented weight was on discharge at 53.5 kg.  Today she is 48.2 kg.  Which is 9.9% total body weight loss.  HPI     Past Medical History:  Diagnosis Date   Anxiety    Herpes simplex type 2 infection    Medical history non-contributory     Patient Active Problem List   Diagnosis Date Noted   Supervision of high risk pregnancy, antepartum 06/28/2021   Hyperthyroidism affecting pregnancy in first trimester 06/20/2021   Hypomagnesemia  06/20/2021   Hypokalemia 06/20/2021   Protein-calorie malnutrition, severe ([place]) 06/20/2021   Hyperemesis gravidarum 06/17/2021   Genital herpes simplex 04/27/2017    Past Surgical History:  Procedure Laterality Date   MOUTH SURGERY       OB History     Gravida  2   Para  1   Term  1   Preterm      AB      Living  1      SAB      IAB      Ectopic      Multiple  0   Live Births  1           No family history on file.  Social History   Tobacco Use   Smoking status: Never   Smokeless tobacco: Never  Vaping Use   Vaping Use: Never used  Substance Use Topics   Alcohol use: No   Drug use: Not Currently    Types: Marijuana    Home Medications Prior to Admission medications   Medication Sig Start Date End Date Taking? Authorizing Provider  Blood Pressure Monitoring (BLOOD PRESSURE KIT) DEVI 1 Device by Does not apply route as needed. 06/28/21   Starr Lake, CNM  dicyclomine (BENTYL) 20 MG tablet Take 1 tablet (20 mg total) by mouth 2 (two) times daily. 12/29/19   Jacqlyn Larsen, PA-C  Doxylamine-Pyridoxine 10-10 MG  Take 2 tabs at bedtime. If symptoms are controlled, continue taking 2 tabs at bedtime. If symptoms persist, take 2 tabs at bedtime, then 1 tab in the morning of Day 3 and 2 tabs at bedtime. If symptoms are controlled on Day 4, continue as scheduled. If symptoms are not controlled, increase dose to 1 tab in the morning, 1 tab midafternoon, and 2 tabs in the evening. Take as scheduled and not on an as needed basis. Max: 4 tabs daily. 06/04/21   Harris, Abigail, PA-C  °famotidine (PEPCID) 20 MG tablet Take 1 tablet (20 mg total) by mouth 2 (two) times daily. 12/29/19   Ford, Kelsey N, PA-C  °glycopyrrolate (ROBINUL) 1 MG tablet Take 2 tablets (2 mg total) by mouth 3 (three) times daily. 06/21/21   Ervin, Michael L, MD  °metoCLOPramide (REGLAN) 10 MG tablet Take 1 tablet (10 mg total) by mouth every 6 (six) hours. 06/21/21   Ervin, Michael  L, MD  °Misc. Devices (GOJJI WEIGHT SCALE) MISC 1 Device by Does not apply route as needed. 06/28/21   Kooistra, Kathryn Lorraine, CNM  °ondansetron (ZOFRAN ODT) 4 MG disintegrating tablet 4mg ODT q4 hours prn nausea/vomit 12/29/19   Ford, Kelsey N, PA-C  °ondansetron (ZOFRAN) 4 MG tablet Take 1 tablet (4 mg total) by mouth every 8 (eight) hours as needed for nausea or vomiting. 06/09/21   Gray, Samuel A, DO  °ondansetron (ZOFRAN-ODT) 4 MG disintegrating tablet Take 1 tablet (4 mg total) by mouth every 6 (six) hours as needed for nausea or vomiting. 06/21/21   Ervin, Michael L, MD  °predniSONE (DELTASONE) 20 MG tablet Take 2 tablets (40 mg total) by mouth daily with breakfast. 06/21/21   Ervin, Michael L, MD  °Prenatal Vit-Fe Fumarate-FA (PRENATAL MULTIVITAMIN) TABS tablet Take 1 tablet by mouth daily at 12 noon.    [provider]  °pyridOXINE (VITAMIN B-6) 100 MG tablet Take 100 mg by mouth daily. °Patient not taking: Reported on 06/28/2021    [provider]  °scopolamine (TRANSDERM-SCOP) 1 MG/3DAYS Place 1 patch (1.5 mg total) onto the skin every 3 (three) days. 06/23/21   Ervin, Michael L, MD  ° ° °Allergies    °Patient has no known allergies. ° °Review of Systems   °Review of Systems  °Constitutional:  Positive for activity change, appetite change and fatigue. Negative for fever.  °Respiratory:  Negative for shortness of breath.   °Gastrointestinal:  Positive for nausea and vomiting. Negative for abdominal pain and diarrhea.  °Genitourinary:  Negative for dysuria, pelvic pain, vaginal bleeding, vaginal discharge and vaginal pain.  °Neurological:  Positive for light-headedness.  °All other systems reviewed and are negative. ° °Physical Exam °Updated Vital Signs °BP 109/68    Pulse 83    Temp 98.2 °F (36.8 °C) (Oral)    Resp 12    Ht 5' 2" (1.575 m)    Wt 48.2 kg    LMP 04/18/2021 (Exact Date)    SpO2 100%    BMI 19.44 kg/m²  ° °Physical Exam °Vitals and nursing note reviewed.  °Constitutional:   °    Appearance: She is underweight. She is ill-appearing.  °   Comments: Thin and ill-appearing.  Nontoxic appearing.  °HENT:  °   Head: Normocephalic and atraumatic.  °   Nose: Nose normal.  °   Mouth/Throat:  °   Mouth: Mucous membranes are moist.  °   Pharynx: Oropharynx is clear. No posterior oropharyngeal erythema.  °Eyes:  °   General:   No scleral icterus. °   Extraocular Movements: Extraocular movements intact.  °   Pupils: Pupils are equal, round, and reactive to light.  °   Comments: Eyes somewhat sunken  °Cardiovascular:  °   Rate and Rhythm: Regular rhythm. Tachycardia present.  °   Pulses: Normal pulses.  °   Heart sounds: No murmur heard. °Pulmonary:  °   Effort: Pulmonary effort is normal. No respiratory distress.  °   Breath sounds: Normal breath sounds.  °Abdominal:  °   General: Abdomen is flat. Bowel sounds are normal. There is no distension.  °   Palpations: Abdomen is soft.  °   Tenderness: There is no abdominal tenderness. There is no guarding.  °Musculoskeletal:     °   General: Normal range of motion.  °   Cervical back: Normal range of motion and neck supple.  °Skin: °   General: Skin is warm and dry.  °   Capillary Refill: Capillary refill takes less than 2 seconds.  °   Coloration: Skin is not jaundiced.  °   Findings: No rash.  °Neurological:  °   General: No focal deficit present.  °   Mental Status: She is alert and oriented to person, place, and time. Mental status is at baseline.  °Psychiatric:     °   Mood and Affect: Mood normal.     °   Behavior: Behavior normal. Behavior is cooperative.     °   Thought Content: Thought content normal.     °   Judgment: Judgment normal.  ° ° °ED Results / Procedures / Treatments   °Labs °(all labs ordered are listed, but only abnormal results are displayed) °Labs Reviewed  °COMPREHENSIVE METABOLIC PANEL - Abnormal; Notable for the following components:  °    Result Value  ° CO2 20 (*)   ° Alkaline Phosphatase 34 (*)   ° Total Bilirubin 2.1 (*)   ° All  other components within normal limits  °CBC WITH DIFFERENTIAL/PLATELET - Abnormal; Notable for the following components:  ° MCV 78.3 (*)   ° RDW 16.8 (*)   ° Neutro Abs 8.0 (*)   ° All other components within normal limits  °LIPASE, BLOOD  °MAGNESIUM  ° °EKG °None ° °Radiology °No results found. ° °Procedures °Procedures  ° °Medications Ordered in ED °Medications  °glycopyrrolate (ROBINUL) tablet 1 mg (has no administration in time range)  °lactated ringers bolus 1,000 mL (has no administration in time range)  °ondansetron (ZOFRAN) injection 4 mg (has no administration in time range)  °sodium chloride 0.9 % bolus 1,000 mL (0 mLs Intravenous Stopped 06/30/21 1153)  °ondansetron (ZOFRAN) injection 4 mg (4 mg Intravenous Given 06/30/21 1118)  ° °ED Course  °I have reviewed the triage vital signs and the nursing notes. ° °Pertinent labs & imaging results that were available during my care of the patient were reviewed by me and considered in my medical decision making (see chart for details). ° °  °MDM Rules/Calculators/A&P °25-year-old female presents to the emergency department with hyperemesis gravidarum  ° °1215: Felt moderately improved after fluids and Zofran, p.o. trialed with ginger ale. °1230: Tolerating ginger ale °1300: Spoke with Dr. Anyanwu with OB/GYN at [facility] who recommends another 4 mg of Zofran at this time, fluid resuscitation.  She is reassured that her electrolytes are all within normal limits.  Recommends continuing her on all of her medications that she is on at home.  She also recommends to   go up on Zofran to 8 mg every 8 hours now that she is past the 10-week gestation mark. ° °Electrolytes within normal limits.  Given 2 L of fluid resuscitation as well as 8 mg of Zofran.  [facility] does not carry Robinul or scopolamine.  Patient vital signs stable and heart rate lowering with fluids.  She has p.o. trialed here with ginger ale and has not had further vomiting.  She does have a  significant amount of secretions.  She states that she has requested Robinul and is waiting for it to be filled.  I have discussed with her the findings on her lab work.  I have also discussed with her that if she does not have p.o. intake with food over the next 24 hours she should present to the maternal assessment unit so that she can continue to be evaluated and have fetal monitoring.  She verbalizes understanding.  Safe for discharge at this time. ° °Final Clinical Impression(s) / ED Diagnoses °Final diagnoses:  °Hyperemesis gravidarum  ° ° °Rx / DC Orders °ED Discharge Orders   ° °      Ordered  °  pyridOXINE (VITAMIN B-6) 100 MG tablet  Daily       ° 06/30/21 1512  °  ondansetron (ZOFRAN-ODT) 8 MG disintegrating tablet  Every 8 hours PRN       ° 06/30/21 1512  °  Doxylamine-Pyridoxine 10-10 MG TBEC  Daily at bedtime       ° 06/30/21 1512  °  scopolamine (TRANSDERM-SCOP) 1 MG/3DAYS  every 72 hours       ° 06/30/21 1512  °  metoCLOPramide (REGLAN) 10 MG tablet  Every 6 hours       ° 06/30/21 1512  ° °  °  ° °  ° °  °Autry, Lauren E, PA-C °06/30/21 1516 ° °  °Pollina, Christopher J, MD °06/30/21 1544 ° °

## 2021-07-05 ENCOUNTER — Inpatient Hospital Stay (HOSPITAL_COMMUNITY)
Admission: AD | Admit: 2021-07-05 | Discharge: 2021-07-05 | Disposition: A | Discharge status: Home / Self Care | Payer: Medicaid Other | Attending: Obstetrics and Gynecology | Admitting: Obstetrics and Gynecology

## 2021-07-05 ENCOUNTER — Encounter (HOSPITAL_COMMUNITY): Payer: Self-pay | Admitting: Obstetrics and Gynecology

## 2021-07-05 ENCOUNTER — Ambulatory Visit (INDEPENDENT_AMBULATORY_CARE_PROVIDER_SITE_OTHER): Payer: Medicaid Other | Admitting: Student

## 2021-07-05 ENCOUNTER — Other Ambulatory Visit: Payer: Self-pay | Admitting: Student

## 2021-07-05 ENCOUNTER — Other Ambulatory Visit: Payer: Self-pay

## 2021-07-05 VITALS — BP 121/90 | HR 101 | Wt 101.9 lb

## 2021-07-05 DIAGNOSIS — Z3A11 11 weeks gestation of pregnancy: Secondary | ICD-10-CM

## 2021-07-05 DIAGNOSIS — R634 Abnormal weight loss: Secondary | ICD-10-CM | POA: Diagnosis not present

## 2021-07-05 DIAGNOSIS — O210 Mild hyperemesis gravidarum: Secondary | ICD-10-CM | POA: Diagnosis present

## 2021-07-05 DIAGNOSIS — O211 Hyperemesis gravidarum with metabolic disturbance: Secondary | ICD-10-CM

## 2021-07-05 DIAGNOSIS — O0990 Supervision of high risk pregnancy, unspecified, unspecified trimester: Secondary | ICD-10-CM | POA: Diagnosis not present

## 2021-07-05 LAB — CBC
HCT: 41.5 % (ref 36.0–46.0)
Hemoglobin: 13.3 g/dL (ref 12.0–15.0)
MCH: 25.4 pg — ABNORMAL LOW (ref 26.0–34.0)
MCHC: 32 g/dL (ref 30.0–36.0)
MCV: 79.3 fL — ABNORMAL LOW (ref 80.0–100.0)
Platelets: 174 10*3/uL (ref 150–400)
RBC: 5.23 MIL/uL — ABNORMAL HIGH (ref 3.87–5.11)
RDW: 16.7 % — ABNORMAL HIGH (ref 11.5–15.5)
WBC: 5.8 10*3/uL (ref 4.0–10.5)
nRBC: 0 % (ref 0.0–0.2)

## 2021-07-05 LAB — COMPREHENSIVE METABOLIC PANEL
ALT: 10 U/L (ref 0–44)
AST: 19 U/L (ref 15–41)
Albumin: 4.2 g/dL (ref 3.5–5.0)
Alkaline Phosphatase: 35 U/L — ABNORMAL LOW (ref 38–126)
Anion gap: 15 (ref 5–15)
BUN: 20 mg/dL (ref 6–20)
CO2: 20 mmol/L — ABNORMAL LOW (ref 22–32)
Calcium: 9.6 mg/dL (ref 8.9–10.3)
Chloride: 102 mmol/L (ref 98–111)
Creatinine, Ser: 1.13 mg/dL — ABNORMAL HIGH (ref 0.44–1.00)
GFR, Estimated: >60 mL/min (ref >60)
Glucose, Bld: 96 mg/dL (ref 70–99)
Potassium: 3.3 mmol/L — ABNORMAL LOW (ref 3.5–5.1)
Sodium: 137 mmol/L (ref 135–145)
Total Bilirubin: 1.7 mg/dL — ABNORMAL HIGH (ref 0.3–1.2)
Total Protein: 7.8 g/dL (ref 6.5–8.1)

## 2021-07-05 MED ORDER — FAMOTIDINE IN NACL 20-0.9 MG/50ML-% IV SOLN
20.0000 mg | Freq: Once | INTRAVENOUS | Status: AC
  Administered 2021-07-05: 14:00:00 20 mg via INTRAVENOUS
  Filled 2021-07-05: qty 50

## 2021-07-05 MED ORDER — LACTATED RINGERS IV BOLUS
1000.0000 mL | Freq: Once | INTRAVENOUS | Status: AC
  Administered 2021-07-05: 14:00:00 1000 mL via INTRAVENOUS

## 2021-07-05 MED ORDER — DEXTROSE 5 % IN LACTATED RINGERS IV BOLUS
1000.0000 mL | Freq: Once | INTRAVENOUS | Status: AC
  Administered 2021-07-05: 16:00:00 1000 mL via INTRAVENOUS

## 2021-07-05 MED ORDER — GLYCOPYRROLATE 0.2 MG/ML IJ SOLN
0.2000 mg | Freq: Once | INTRAMUSCULAR | Status: AC
  Administered 2021-07-05: 14:00:00 0.2 mg via INTRAVENOUS
  Filled 2021-07-05: qty 1

## 2021-07-05 MED ORDER — ASPIRIN EC 81 MG PO TBEC: 81.0000 mg | DELAYED_RELEASE_TABLET | Freq: Every day | ORAL | 11 refills | Status: DC

## 2021-07-05 MED ORDER — PROMETHAZINE HCL 25 MG PO TABS: 25.0000 mg | ORAL_TABLET | Freq: Four times a day (QID) | ORAL | 1 refills | Status: DC | PRN

## 2021-07-05 MED ORDER — SODIUM CHLORIDE 0.9 % IV SOLN
25.0000 mg | Freq: Once | INTRAVENOUS | Status: AC
  Administered 2021-07-05: 14:00:00 25 mg via INTRAVENOUS
  Filled 2021-07-05: qty 1

## 2021-07-05 NOTE — Progress Notes (Signed)
Patient has elevated PHQ-9/GAD-7 but has declined [facility].   Alesia Richards, RN 07/05/21

## 2021-07-05 NOTE — Addendum Note (Signed)
Addended by: Cline Crock on: 07/05/2021 05:06 PM   Modules accepted: Orders

## 2021-07-05 NOTE — Discharge Instructions (Signed)
The phenergan (promethazine) can be placed vaginally or rectally

## 2021-07-05 NOTE — MAU Note (Signed)
Presents stating she's dehydrated, has nonstop N/V.  Reports been taking meds as prescribed, but meds won't stay down.  Denies VB or LOF.

## 2021-07-05 NOTE — Progress Notes (Addendum)
°  Subjective:    Patricia Craig is being seen today for her first obstetrical visit.  This is not a planned pregnancy. She is at [redacted]w[redacted]d gestation. Her obstetrical history is significant for  hyperemesis gravidarum . Relationship with FOB: significant other, living together. Patient does intend to breast feed. Pregnancy history fully reviewed.  Patient reports constant vomiting and weight loss, reports she feels weak and dizzy. She is unable to leave a urine sample and is actively vomiting while in exam room.   Review of Systems:   Review of Systems  Constitutional: Negative.   HENT: Negative.    Respiratory: Negative.    Cardiovascular: Negative.   Gastrointestinal: Negative.   Genitourinary: Negative.   Neurological: Negative.   Hematological: Negative.   Psychiatric/Behavioral: Negative.     Objective:     BP 121/90    Pulse (!) 101    Wt 101 lb 14.4 oz (46.2 kg)    LMP 04/18/2021 (Exact Date)    BMI 18.64 kg/m  Physical Exam Constitutional:      Appearance: She is ill-appearing.  HENT:     Mouth/Throat:     Mouth: Mucous membranes are dry.  Pulmonary:     Effort: Pulmonary effort is normal.  Musculoskeletal:        General: Normal range of motion.  Skin:    General: Skin is warm and dry.     Coloration: Skin is pale.  Neurological:     Motor: Weakness present.  Psychiatric:     Comments: Appears lethargic and listless    Exam    Assessment:    Pregnancy: G2P1001 Patient Active Problem List   Diagnosis Date Noted   Supervision of high risk pregnancy, antepartum 06/28/2021   Hyperthyroidism affecting pregnancy in first trimester 06/20/2021   Hypomagnesemia 06/20/2021   Hypokalemia 06/20/2021   Protein-calorie malnutrition, severe ([facility]) 06/20/2021   Hyperemesis gravidarum 06/17/2021   Genital herpes simplex 04/27/2017       Plan:     Initial labs drawn. Prenatal vitamins. Problem list reviewed and updated. AFP3 discussed:  too early . Role of  ultrasound in pregnancy discussed; fetal survey: ordered. Amniocentesis discussed: not indicated. Follow up in 4 weeks. 75% of 30 min visit spent on counseling and coordination of care.  -patient appears unwell; reviewed that she was inpatient on [facility] and that she should go back to MAU for reassessment and fluids.  -I advised patient against clinic lab draw today based on fact that it might make it harder to get labs drawn in MAU and IV started but she wants labs drawn and states that "the genetic testing information will make me happy" -started on baby ASA -MAU alerted to patient enroute and RN here will coordinate transport  Charlesetta Garibaldi [facility] 07/05/2021

## 2021-07-05 NOTE — Addendum Note (Signed)
Addended by: Chrystie Nose on: 07/05/2021 01:12 PM   Modules accepted: Orders

## 2021-07-05 NOTE — MAU Provider Note (Signed)
History     CSN: [id number]  Arrival date and time: 07/05/21 1225   Event Date/Time   First Provider Initiated Contact with Patient 07/05/21 1344      Chief Complaint  Patient presents with   Nausea   Emesis   HPI Patricia Craig is a 25 y.o. G2P1001 at [redacted]w[redacted]d who presents with nausea & vomiting. Was admitted earlier this month for hyperemesis. States she did well for a week after discharge but since then has gotten worse. Has been unable to keep down her medications for the last few days. Currently has scop patch & bilateral sea sick wrist bands on. No longer can eat the few foods that she could tolerate before so has not been getting intake for the last few days. Was seen in the office for her new ob this morning & sent here for IV fluids.  States her pre pregnancy weight was 126 lbs & is now down to 99 lbs. Reports that she hasn't had a bowel movement in over a month due to limited intake.  No abdominal pain, fever, or vaginal bleeding.   OB History     Gravida  2   Para  1   Term  1   Preterm      AB      Living  1      SAB      IAB      Ectopic      Multiple  0   Live Births  1           Past Medical History:  Diagnosis Date   Anxiety    Herpes simplex type 2 infection     Past Surgical History:  Procedure Laterality Date   MOUTH SURGERY      History reviewed. No pertinent family history.  Social History   Tobacco Use   Smoking status: Never   Smokeless tobacco: Never  Vaping Use   Vaping Use: Never used  Substance Use Topics   Alcohol use: No   Drug use: Not Currently    Types: Marijuana    Comment: 2021    Allergies: No Known Allergies  No medications prior to admission.    Review of Systems  Constitutional:  Positive for fatigue. Negative for chills and fever.  Respiratory: Negative.    Cardiovascular: Negative.   Gastrointestinal:  Positive for constipation, nausea and vomiting. Negative for abdominal pain.   Genitourinary: Negative.   Neurological:  Positive for dizziness and weakness. Negative for headaches.  Physical Exam   Blood pressure 122/74, pulse (!) 107, temperature 97.7 F (36.5 C), temperature source Oral, resp. rate 15, height 5\' 2"  (1.575 m), weight 45.1 kg, last menstrual period 04/18/2021, SpO2 99 %, unknown if currently breastfeeding.  Physical Exam Vitals and nursing note reviewed.  Constitutional:      Appearance: She is underweight.  HENT:     Head: Normocephalic and atraumatic.     Mouth/Throat:     Mouth: Mucous membranes are dry.  Eyes:     General: No scleral icterus.    Conjunctiva/sclera: Conjunctivae normal.     Pupils: Pupils are equal, round, and reactive to light.  Cardiovascular:     Rate and Rhythm: Tachycardia present.  Pulmonary:     Effort: Pulmonary effort is normal. No respiratory distress.  Skin:    General: Skin is warm and dry.  Neurological:     General: No focal deficit present.     Mental Status: She is  alert and oriented to person, place, and time.  Psychiatric:        Mood and Affect: Mood normal.        Behavior: Behavior normal. Behavior is cooperative.    MAU Course  Procedures Results for orders placed or performed during the hospital encounter of 07/05/21 (from the past 24 hour(s))  CBC     Status: Abnormal   Collection Time: 07/05/21  2:12 PM  Result Value Ref Range   WBC 5.8 4.0 - 10.5 K/uL   RBC 5.23 (H) 3.87 - 5.11 MIL/uL   Hemoglobin 13.3 12.0 - 15.0 g/dL   HCT 00.1 74.9 - 44.9 %   MCV 79.3 (L) 80.0 - 100.0 fL   MCH 25.4 (L) 26.0 - 34.0 pg   MCHC 32.0 30.0 - 36.0 g/dL   RDW 67.5 (H) 91.6 - 38.4 %   Platelets 174 150 - 400 K/uL   nRBC 0.0 0.0 - 0.2 %  Comprehensive metabolic panel     Status: Abnormal   Collection Time: 07/05/21  2:12 PM  Result Value Ref Range   Sodium 137 135 - 145 mmol/L   Potassium 3.3 (L) 3.5 - 5.1 mmol/L   Chloride 102 98 - 111 mmol/L   CO2 20 (L) 22 - 32 mmol/L   Glucose, Bld 96 70 - 99  mg/dL   BUN 20 6 - 20 mg/dL   Creatinine, Ser 6.65 (H) 0.44 - 1.00 mg/dL   Calcium 9.6 8.9 - 99.3 mg/dL   Total Protein 7.8 6.5 - 8.1 g/dL   Albumin 4.2 3.5 - 5.0 g/dL   AST 19 15 - 41 U/L   ALT 10 0 - 44 U/L   Alkaline Phosphatase 35 (L) 38 - 126 U/L   Total Bilirubin 1.7 (H) 0.3 - 1.2 mg/dL   GFR, Estimated >57 >01 mL/min   Anion gap 15 5 - 15    MDM Patient presents with n/v. Was admitted 12/2-12/6 for HEG & was discharged with antiemetics & 5 day steroid burst. At time of discharge she weighed 53.5 kg & today weighs 45.1 kg.  While in MAU, given IV lr bolus followed by a liter of d5lr, phenergan 25 mg, & pepcid 20 mg. Reports improvement in symptoms & able to keep down some ginger ale & a cracker.  Reviewed labs, weight loss, & tx with Dr. Jolayne Panther. Patient is able to tolerate POs & stable for discharge. Will work on outpatient infusions. Pt is agreeable with this plan.   Assessment and Plan   1. Hyperemesis gravidarum with dehydration  -Rx phenergan & discussed using vaginally/rectally if can't tolerate orally -continue other meds as prescribed -message to office to schedule outpatient IV fluid/phenergan infusions  2. Weight loss, unintentional   3. [redacted] weeks gestation of pregnancy      [name] 07/05/2021, 5:41 PM

## 2021-07-05 NOTE — Patient Instructions (Signed)
AREA PEDIATRIC/FAMILY PRACTICE PHYSICIANS  Central/[place]outheast [place] ([address]) [facility] Chambliss, MD; Eniola, MD; Hale, MD; Hensel, MD; McDiarmid, MD; McIntyer, MD; Neal, MD; Walden, MD [address] [place], [place] [address] [phone] Mon-Fri 8:30-12:30, 1:30-5:00 Providers come to see babies at [facility] Accepting Medicaid [facility] providers who accept newborns: Koirala, MD; Morrow, MD; Wolters, MD [address], [place], [place] [address] [phone] Mon-Fri 8:00-5:30 Babies seen by providers at [facility] Does NOT accept Medicaid Please call early in hospitalization for appointment (limited availability)  [facility] Mulberry, MD [address] [place], [place] [address] [phone] Mon, Tue, Thur, Fri 8:30-5:00, Wed 10:00-7:00 (closed 1-2pm) Babies seen by [facility] providers Accepting Medicaid Rubin - Pediatrician Rubin, MD [address], [place], [place] [address] [phone] Mon-Fri 8:30-5:00, Sat 8:30-12:00 Provider comes to see babies at [facility] Accepting Medicaid Must have been referred from current patients or contacted office prior to delivery [facility] ([facility]) Brown, MD; Chandler, MD; Ettefagh, MD; Grant, MD; Lester, MD; McCormick, MD; McQueen, MD; Prose, MD; Simha, MD; Stanley, MD; Stryffeler, NP; Tebben, NP [address], [place], [place] [address] [phone] Mon, Tue, Thur, Fri 8:30-5:30, Wed 9:30-5:30, Sat 8:30-12:30 Babies seen by [facility] providers Accepting Medicaid Only accepting infants of first-time parents or siblings of current patients Hospital discharge coordinator will make follow-up appointment Jack Amos [address], [place], [place]  [address] [phone]   Fax - [phone] [facility] [address], [place], [place]  [address] Phone - [phone]   Fax - [phone] Shilpa Gosrani [address], [place], [place]  [address] [phone]  [place]ortheast [place] ([address]) [facility] Bates, MD; Brassfield, MD; Cooper, Cox, MD; MD; Davis, MD; Dovico, MD; Ettefaugh, MD; Little, MD; Lowe, MD; Keiffer, MD; Melvin, MD; Sumner, MD; Williams, MD [address] [place], [place] [address] [phone] Mon-Fri 8:30-5:00 (extended evenings Mon-Thur as needed), Sat-Sun 10:00-1:00 Providers come to see babies at [facility] Accepting Medicaid for families of first-time babies and families with all children in the household age 3 and under. Must register with office prior to making appointment (M-F only). [facility] Henson, NP; Knapp, MD; Lalonde, MD; Tysinger, PA [address] [place], [place] [address] [phone] Mon-Fri 8:00-5:00 Babies seen by providers at [facility] Does NOT accept Medicaid/Commercial Insurance Only [facility] ([facility])  Artis, MD; Barnes, MD; Bratton, MD; Coccaro, MD; Lockett Gardner, MD; Kramer, MD; Marshall, MD; Netherton, MD; Poleto, MD; Skinner, MD [address] [place], [place] [address] [phone] Mon-Fri 8:30-5:30, Sat (Oct.-Mar.) 9:00-1:00 Babies seen by providers at [facility] Accepting Medicaid  [place] ([address]) [facility] Reid, MD; Warner, MD [address], [place],  [address] [phone] Mon-Fri 8:30-5:00, Sat 8:30-12:00 Providers come to see babies at [facility] Does NOT accept Medicaid [facility] Becker, PA; Hagler, MD; Scifres, PA; Sun, MD; Swayne, MD [address], [place],  [address] [phone] Mon-Fri 8:00-5:00 Babies seen by providers at [facility] Does NOT accept Medicaid Only accepting babies of parents who  are patients Please call early in hospitalization for appointment (limited availability) [facility] Clark, MD; Frye, MD; Kelleher, MD; Mack, NP; Miller, MD; O'Keller, MD; Patterson, NP; Pudlo, MD; Puzio, MD; Thomas, MD; Tucker, MD; Twiselton, MD [address], [place], [place] [address] [phone] Mon-Fri 8:00-5:00, Sat 9:00-12:00 Providers come to see babies at [facility] Does NOT accept Medicaid  [place] ([address]) [facility] providers accepting new patients: Brake, NP; Wharton, PA [address], [place], [place] [address] [phone] Mon-Fri 8:00-5:00 Babies seen by providers at [facility] Does NOT accept Medicaid Only accepting babies of parents who are patients Please call early in hospitalization for appointment (limited availability) [facility] Gay, MD; Quinlan, MD [address] [place], [place] [address] [phone] (press [phone] to schedule appointment) Mon-Fri 8:00-5:00 Providers come to see babies at [facility] Does NOT accept Medicaid [facility] Mazer, MD [address] [place], [place] [address] [phone] Mon-Fri 8:30-5:00 (lunch 12:30-1:00), extended hours by appointment only Wed 5:00-6:30 Babies seen by [facility] providers Accepting Medicaid [facility] [place], MD; Jordan, MD; Koberlein, MD [address], [place], [place] [address] [phone] Mon-Fri 8:00-5:00 Babies seen by [facility] providers Does NOT accept Medicaid [facility] Parker, MD; Hunter, MD; Wallace, DO [address] [place], [place] [address] [phone] Mon-Fri 8:00-5:00 Babies seen by [facility] providers Does NOT accept Medicaid [facility] Brandon, PA; Brecken, PA; Christy, NP; Dees, MD; DeClaire, MD; DeWeese, MD; Hansen, NP; Mills, NP; Parrish, NP; Smoot, NP; Summer, MD; Vapne,  MD [address] [place], [place] [address] [phone] Mon-Fri 8:30-5:00, Sat 10:00-1:00 Providers come to see babies at [facility] Does NOT accept Medicaid Free prenatal information session Tuesdays at 4:45pm [facility] Bouska, MD; Gordon, PA; Jeffery, PA; Weber, PA [address] [place] [address] [phone] Mon-Fri 7:30-5:30 Babies seen by [facility] providers [facility] [address], [place], [place]  [address] [phone]   Fax - [phone]  [place] ([address] & [address]) [facility] [place] [address] [place], [place] [address] [phone] Mon-Thur 8:00-6:00 Providers come to see babies at [facility] Accepting Medicaid [facility] Anderson, NP; Badger, MD; Beal, PA; Spencer, PA [address] [place], [place] [address] [phone] Mon-Thur 7:30-7:30, Fri 7:30-4:30 Babies seen by [facility] providers Accepting Medicaid [facility] Agbuya, MD; Klett, NP; Romgoolam, MD [address], [place], [place] [address] [phone] Mon-Fri 8:30-5:00, Sat 8:30-12:00 Providers come to see babies at [facility] Accepting Medicaid Must have "Meet & Greet" appointment at office prior to delivery [facility] ([facility]) McCord, MD; Wallace, MD; Wood, MD [address], [place], [place] [address] [phone] Mon-Wed 8:00-6:00, Thur-Fri 8:00-5:00, Sat 9:00-12:00 Providers come to see babies at [facility] Does NOT accept Medicaid Only accepting siblings of current patients [facility]  [address], [place], [place]  [address] [phone]   Fax - [phone] [facility] [address], [place], [place]  [address] [phone]   Fax -  [phone]  [place] ([address] & [address]) [facility] Cirigliano, DO; Matthews, DO [address] , [place] [address] [phone] Mon-Fri 7:00-5:00 Babies seen by [facility] providers Does NOT accept Medicaid [facility] Briscoe, MD; Howley, PA; Moreira, PA [address], [place],  [address] [phone] Mon-Fri 8:00-5:00 Babies seen by [facility] providers Accepting Medicaid [facility] Boyd, MD; Church, PA; Jones, NP; Osborn, PA [address], ,  [address] [phone] Mon-Fri 8:00-5:00 Babies seen by providers at [facility] Accepting Medicaid  [place] ([address]) [facility] [place] Wendling, DO [address] [place], [place] [address] [phone] Mon-Fri 8:00-5:00 Babies seen by [facility] providers Does NOT accept Medicaid Limited availability, please call early in hospitalization to schedule follow-up [facility] Calderon, PA; Cummings, MD; Dillard, MD; Martin, PA; Olson, MD; VanDeven, PA [address], [place], [place] [address] [phone] Mon-Fri 8:30-5:00, Sat 9:00-12:00 Babies seen by providers at [facility] Accepting Medicaid Please register online then schedule online or call office [email or web address] Hunter, NP; Kumar, MD; Martin Rogers, PA [address], [place], [place] [address] [phone] Mon-Fri 8:00-5:00 Babies seen by providers at [facility] Accepting Medicaid [facility] ([facility] Commerce, MD; Kristi Fleenor, NP; West, MD [address], [place], [place] [address] [phone] Mon-Fri 8:00-5:30, Sat&Sun by appointment (phones open at  8:30) Babies seen by [facility] providers Accepting Medicaid Must be a first-time baby or sibling of current patient [facility] [place]  [address], [place], [place]  [address] [phone]   Fax - [phone]  [place] ([address]) [facility] Brown, PA; Cowen, PA; Rice, MD; Helton, PA; Spry, MD [address] [place], [place] [address] [phone] Mon-Thur 8:00-7:00, Fri 8:00-5:00, Sat 8:00-12:00, Sun 9:00-12:00 Babies seen by [facility] providers Accepting Medicaid [facility] Coe-Goins, MD; Marshall, MD; Pierre-Louis, MD [address], [place], [place] [address] [phone] Mon-Thur 8:00-5:00 Babies seen by providers at [facility] Accepting Medicaid [facility] Bratton, MD; Coe-Goins, MD; Hayes, MD; Lewis, MD; List, MD; Lott, MD; Marshall, MD; Moran, MD; O'Neal, MD; Pierre-Louis, MD; Pitonzo, MD; Scholer, MD; Spangle, MD [address] [place], [place] [address] [phone] Mon-Fri 8:00-5:30, Sat (Oct.-Mar.) 9:00-1:00 Babies seen by providers at [facility] Accepting Medicaid Must fill out new patient packet, available online at [email or web address] [facility] ([facility]) Friddle, NP; Harris, NP; Kelly, NP; Logan, MD; Melvin, PA; Poth, MD; Ramadoss, MD; Stanton, NP [address], [place], [place] [address] [phone] Mon-Thur 8:00-5:30, Fri 8:00-5:00 Babies seen by providers at [facility] Accepting Medicaid  [place] ([address]) [facility] Dixon, PA; Paradise, MD; Pickard, MD; Tapia, PA [address], [place], [place] [address] [phone] Mon-Fri 8:00-5:00 Babies seen by providers at [facility] Accepting Medicaid   [place] ([address]) [facility] [place] Masneri, DO; Meyers, MD; Nelson, PA [address], [place], [place] [address] [phone] Mon-Fri 8:00-5:00 Babies seen by providers at [facility] Does NOT accept Medicaid Limited appointment availability, please call early in hospitalization  [facility] [place] Kunedd, DO; McGowen, MD [address], [place], [place] [address] [phone] Mon-Fri 8:00-5:00 Babies seen by [facility] providers Does NOT accept Medicaid [facility] [place] Cameron, MD; MacDonald, MD; Michaels, PA; Nayak, MD [address] [place] [address], [place],  [address] [phone] Mon-Fri 8:00-5:00 After hours clinic ([address] [place],  [address]) [phone] Mon-Fri 5:00-8:00, Sat 12:00-6:00, Sun 10:00-4:00 Babies seen by [facility] providers Accepting Medicaid [facility] [place] [address], [place], [place]  [address] [phone]   Fax - [phone]  [place] ([address]) [facility] [place] [facility] Andy, MD [address], [place], [place] [address] [phone] Mon-Fri 8:00-5:00 Babies seen by [facility] providers Does NOT accept Medicaid [facility] [place] ([facility] [place]) Eksir, MD [address], [place], [place] [address] [phone] Mon-Thur 8:00-7:00, Fri 8:00-5:00, Sat 8:00-12:00 Babies seen by providers at [facility] Accepting Medicaid - but does not have vaccinations in office (must be received elsewhere) Limited availability, please call early in hospitalization  [place] ([address]) [facility]  Charlene Flemming, MD [address], [place] [address] [phone]  Fax [phone]  [facility]  Kimberly Newton, MD, Annamarie Streilein, PA, Carla Hampton, PA [address] [place], [place]  [address] [phone] [facility]  [address], [place], [place] [address] [phone] [address], [place], [place] [address] [phone] (West Office)  [facility] [address], [place], [place] [address] [phone] [facility] [address], [place], [place] [address] [phone] [facility] [address], [place], [place] [address] [phone] [facility] [address], [place], [place] [address] [phone] [facility] [address], [place], [place] [address] [phone] [facility] [address], [place], [place] [address] [phone] [facility]  [address], [place], [place] [address] [phone] [facility] [address] [place], [place], [place] [address] [phone] [facility] [address], [place], [place] [address] [phone] [facility] [address], , [place] [address] [phone]  

## 2021-07-06 LAB — CBC/D/PLT+RPR+RH+ABO+RUBIGG...
Antibody Screen: NEGATIVE
Basophils Absolute: 0 10*3/uL (ref 0.0–0.2)
Basos: 1 %
EOS (ABSOLUTE): 0 10*3/uL (ref 0.0–0.4)
Eos: 0 %
HCV Ab: <0.1 s/co ratio (ref 0.0–0.9)
HIV Screen 4th Generation wRfx: NONREACTIVE
Hematocrit: 41.9 % (ref 34.0–46.6)
Hemoglobin: 13.5 g/dL (ref 11.1–15.9)
Hepatitis B Surface Ag: NEGATIVE
Immature Grans (Abs): 0 10*3/uL (ref 0.0–0.1)
Immature Granulocytes: 0 %
Lymphocytes Absolute: 1.2 10*3/uL (ref 0.7–3.1)
Lymphs: 22 %
MCH: 25.5 pg — ABNORMAL LOW (ref 26.6–33.0)
MCHC: 32.2 g/dL (ref 31.5–35.7)
MCV: 79 fL (ref 79–97)
Monocytes Absolute: 0.4 10*3/uL (ref 0.1–0.9)
Monocytes: 7 %
Neutrophils Absolute: 3.8 10*3/uL (ref 1.4–7.0)
Neutrophils: 70 %
Platelets: 205 10*3/uL (ref 150–450)
RBC: 5.29 x10E6/uL — ABNORMAL HIGH (ref 3.77–5.28)
RDW: 17.1 % — ABNORMAL HIGH (ref 11.7–15.4)
RPR Ser Ql: NONREACTIVE
Rh Factor: POSITIVE
Rubella Antibodies, IGG: 3.15 index (ref >0.99)
WBC: 5.4 10*3/uL (ref 3.4–10.8)

## 2021-07-06 LAB — HCV INTERPRETATION

## 2021-07-06 LAB — HEMOGLOBIN A1C
Est. average glucose Bld gHb Est-mCnc: 105 mg/dL
Hgb A1c MFr Bld: 5.3 % (ref 4.8–5.6)

## 2021-07-11 ENCOUNTER — Inpatient Hospital Stay (HOSPITAL_BASED_OUTPATIENT_CLINIC_OR_DEPARTMENT_OTHER)
Admission: EM | Admit: 2021-07-11 | Discharge: 2021-07-12 | DRG: 819 | Disposition: A | Discharge status: Home / Self Care | Payer: Medicaid Other | Attending: Obstetrics & Gynecology | Admitting: Obstetrics & Gynecology

## 2021-07-11 ENCOUNTER — Other Ambulatory Visit: Payer: Self-pay

## 2021-07-11 ENCOUNTER — Encounter (HOSPITAL_BASED_OUTPATIENT_CLINIC_OR_DEPARTMENT_OTHER): Payer: Self-pay | Admitting: *Deleted

## 2021-07-11 DIAGNOSIS — Z79899 Other long term (current) drug therapy: Secondary | ICD-10-CM | POA: Diagnosis not present

## 2021-07-11 DIAGNOSIS — O99281 Endocrine, nutritional and metabolic diseases complicating pregnancy, first trimester: Secondary | ICD-10-CM | POA: Diagnosis present

## 2021-07-11 DIAGNOSIS — O211 Hyperemesis gravidarum with metabolic disturbance: Principal | ICD-10-CM | POA: Diagnosis present

## 2021-07-11 DIAGNOSIS — Z3A12 12 weeks gestation of pregnancy: Secondary | ICD-10-CM

## 2021-07-11 DIAGNOSIS — E876 Hypokalemia: Secondary | ICD-10-CM | POA: Diagnosis present

## 2021-07-11 DIAGNOSIS — O210 Mild hyperemesis gravidarum: Secondary | ICD-10-CM | POA: Diagnosis present

## 2021-07-11 DIAGNOSIS — Z20822 Contact with and (suspected) exposure to covid-19: Secondary | ICD-10-CM | POA: Diagnosis present

## 2021-07-11 DIAGNOSIS — O021 Missed abortion: Secondary | ICD-10-CM | POA: Diagnosis not present

## 2021-07-11 LAB — COMPREHENSIVE METABOLIC PANEL
ALT: 10 U/L (ref 0–44)
AST: 17 U/L (ref 15–41)
Albumin: 2.9 g/dL — ABNORMAL LOW (ref 3.5–5.0)
Alkaline Phosphatase: 25 U/L — ABNORMAL LOW (ref 38–126)
Anion gap: 9 (ref 5–15)
BUN: 6 mg/dL (ref 6–20)
CO2: 19 mmol/L — ABNORMAL LOW (ref 22–32)
Calcium: 7.8 mg/dL — ABNORMAL LOW (ref 8.9–10.3)
Chloride: 108 mmol/L (ref 98–111)
Creatinine, Ser: 0.53 mg/dL (ref 0.44–1.00)
GFR, Estimated: >60 mL/min (ref >60)
Glucose, Bld: 81 mg/dL (ref 70–99)
Potassium: 2.8 mmol/L — ABNORMAL LOW (ref 3.5–5.1)
Sodium: 136 mmol/L (ref 135–145)
Total Bilirubin: 1.2 mg/dL (ref 0.3–1.2)
Total Protein: 5.4 g/dL — ABNORMAL LOW (ref 6.5–8.1)

## 2021-07-11 LAB — CBC WITH DIFFERENTIAL/PLATELET
Abs Immature Granulocytes: 0.03 10*3/uL (ref 0.00–0.07)
Basophils Absolute: 0 10*3/uL (ref 0.0–0.1)
Basophils Relative: 1 %
Eosinophils Absolute: 0 10*3/uL (ref 0.0–0.5)
Eosinophils Relative: 1 %
HCT: 38.6 % (ref 36.0–46.0)
Hemoglobin: 12.7 g/dL (ref 12.0–15.0)
Immature Granulocytes: 1 %
Lymphocytes Relative: 30 %
Lymphs Abs: 1.1 10*3/uL (ref 0.7–4.0)
MCH: 26.1 pg (ref 26.0–34.0)
MCHC: 32.9 g/dL (ref 30.0–36.0)
MCV: 79.4 fL — ABNORMAL LOW (ref 80.0–100.0)
Monocytes Absolute: 0.4 10*3/uL (ref 0.1–1.0)
Monocytes Relative: 10 %
Neutro Abs: 2.1 10*3/uL (ref 1.7–7.7)
Neutrophils Relative %: 57 %
Platelets: 162 10*3/uL (ref 150–400)
RBC: 4.86 MIL/uL (ref 3.87–5.11)
RDW: 16.8 % — ABNORMAL HIGH (ref 11.5–15.5)
WBC: 3.7 10*3/uL — ABNORMAL LOW (ref 4.0–10.5)
nRBC: 0 % (ref 0.0–0.2)

## 2021-07-11 LAB — BASIC METABOLIC PANEL
Anion gap: 14 (ref 5–15)
BUN: 11 mg/dL (ref 6–20)
CO2: 26 mmol/L (ref 22–32)
Calcium: 9.2 mg/dL (ref 8.9–10.3)
Chloride: 96 mmol/L — ABNORMAL LOW (ref 98–111)
Creatinine, Ser: 0.8 mg/dL (ref 0.44–1.00)
GFR, Estimated: >60 mL/min (ref >60)
Glucose, Bld: 102 mg/dL — ABNORMAL HIGH (ref 70–99)
Potassium: 2.4 mmol/L — CL (ref 3.5–5.1)
Sodium: 136 mmol/L (ref 135–145)

## 2021-07-11 LAB — RESP PANEL BY RT-PCR (FLU A&B, COVID) ARPGX2
Influenza A by PCR: NEGATIVE
Influenza B by PCR: NEGATIVE
SARS Coronavirus 2 by RT PCR: NEGATIVE

## 2021-07-11 LAB — HCG, QUANTITATIVE, PREGNANCY: hCG, Beta Chain, Quant, S: 125053 m[IU]/mL — ABNORMAL HIGH (ref <5)

## 2021-07-11 LAB — MAGNESIUM
Magnesium: 2 mg/dL (ref 1.7–2.4)
Magnesium: 1.7 mg/dL (ref 1.7–2.4)

## 2021-07-11 MED ORDER — METHYLPREDNISOLONE 16 MG PO TABS
16.0000 mg | ORAL_TABLET | Freq: Every day | ORAL | Status: DC
  Filled 2021-07-11: qty 1

## 2021-07-11 MED ORDER — ONDANSETRON 4 MG PO TBDP: 4.0000 mg | ORAL_TABLET | Freq: Four times a day (QID) | ORAL | Status: DC | PRN

## 2021-07-11 MED ORDER — ZOLPIDEM TARTRATE 5 MG PO TABS: 5.0000 mg | ORAL_TABLET | Freq: Every evening | ORAL | Status: DC | PRN

## 2021-07-11 MED ORDER — DOXYLAMINE-PYRIDOXINE 10-10 MG PO TBEC: 2.0000 | DELAYED_RELEASE_TABLET | Freq: Every day | ORAL | Status: DC

## 2021-07-11 MED ORDER — SODIUM CHLORIDE 0.9 % IV BOLUS
1000.0000 mL | Freq: Once | INTRAVENOUS | Status: AC
  Administered 2021-07-11: 14:00:00 1000 mL via INTRAVENOUS

## 2021-07-11 MED ORDER — LACTATED RINGERS IV SOLN: INTRAVENOUS | Status: DC

## 2021-07-11 MED ORDER — PYRIDOXINE HCL 100 MG/ML IJ SOLN
100.0000 mg | Freq: Once | INTRAMUSCULAR | Status: AC
  Administered 2021-07-11: 12:00:00 100 mg via INTRAVENOUS
  Filled 2021-07-11: qty 1

## 2021-07-11 MED ORDER — SODIUM CHLORIDE 0.9 % IV SOLN: 25.0000 mg | INTRAVENOUS | Status: DC

## 2021-07-11 MED ORDER — METHYLPREDNISOLONE 4 MG PO TABS: 8.0000 mg | ORAL_TABLET | Freq: Every day | ORAL | Status: DC

## 2021-07-11 MED ORDER — ACETAMINOPHEN 325 MG PO TABS
650.0000 mg | ORAL_TABLET | ORAL | Status: DC | PRN
  Administered 2021-07-12: 04:00:00 650 mg via ORAL
  Filled 2021-07-11: qty 2

## 2021-07-11 MED ORDER — SODIUM CHLORIDE 0.9 % IV BOLUS
1000.0000 mL | Freq: Once | INTRAVENOUS | Status: AC
  Administered 2021-07-11: 12:00:00 1000 mL via INTRAVENOUS

## 2021-07-11 MED ORDER — METHYLPREDNISOLONE 16 MG PO TABS: 16.0000 mg | ORAL_TABLET | Freq: Every day | ORAL | Status: DC

## 2021-07-11 MED ORDER — METHYLPREDNISOLONE SODIUM SUCC 125 MG IJ SOLR
48.0000 mg | Freq: Once | INTRAMUSCULAR | Status: AC
  Administered 2021-07-11: 23:00:00 48 mg via INTRAVENOUS
  Filled 2021-07-11: qty 2

## 2021-07-11 MED ORDER — GLYCOPYRROLATE 1 MG PO TABS: 2.0000 mg | ORAL_TABLET | Freq: Three times a day (TID) | ORAL | Status: DC

## 2021-07-11 MED ORDER — CALCIUM CARBONATE ANTACID 500 MG PO CHEW: 2.0000 | CHEWABLE_TABLET | ORAL | Status: DC | PRN

## 2021-07-11 MED ORDER — POTASSIUM CHLORIDE 10 MEQ/100ML IV SOLN
10.0000 meq | INTRAVENOUS | Status: AC
  Administered 2021-07-11 (×3): 10 meq via INTRAVENOUS
  Filled 2021-07-11 (×2): qty 100

## 2021-07-11 MED ORDER — PRENATAL MULTIVITAMIN CH: 1.0000 | ORAL_TABLET | Freq: Every day | ORAL | Status: DC

## 2021-07-11 MED ORDER — PROMETHAZINE HCL 25 MG PO TABS: 25.0000 mg | ORAL_TABLET | Freq: Four times a day (QID) | ORAL | Status: DC | PRN

## 2021-07-11 MED ORDER — METHYLPREDNISOLONE 4 MG PO TABS: 4.0000 mg | ORAL_TABLET | Freq: Every day | ORAL | Status: DC

## 2021-07-11 MED ORDER — SCOPOLAMINE 1 MG/3DAYS TD PT72
1.0000 | MEDICATED_PATCH | TRANSDERMAL | Status: DC
  Administered 2021-07-12: 04:00:00 1.5 mg via TRANSDERMAL
  Filled 2021-07-11: qty 1

## 2021-07-11 MED ORDER — SODIUM CHLORIDE 0.9 % IV SOLN
25.0000 mg | Freq: Four times a day (QID) | INTRAVENOUS | Status: DC | PRN
  Filled 2021-07-11: qty 1

## 2021-07-11 MED ORDER — FAMOTIDINE 20 MG PO TABS
20.0000 mg | ORAL_TABLET | Freq: Two times a day (BID) | ORAL | Status: DC
  Filled 2021-07-11: qty 1

## 2021-07-11 MED ORDER — ASPIRIN EC 81 MG PO TBEC: 81.0000 mg | DELAYED_RELEASE_TABLET | Freq: Every day | ORAL | Status: DC

## 2021-07-11 MED ORDER — ONDANSETRON 4 MG PO TBDP: 8.0000 mg | ORAL_TABLET | Freq: Three times a day (TID) | ORAL | Status: DC | PRN

## 2021-07-11 MED ORDER — ONDANSETRON HCL 4 MG/2ML IJ SOLN
4.0000 mg | Freq: Once | INTRAMUSCULAR | Status: AC
  Administered 2021-07-11: 12:00:00 4 mg via INTRAVENOUS
  Filled 2021-07-11: qty 2

## 2021-07-11 NOTE — ED Notes (Signed)
Pt drinking ginger ale no vomiting at thsis time

## 2021-07-11 NOTE — ED Triage Notes (Signed)
Vomiting since yesterday. She is [redacted] weeks pregnant. No bleeding.

## 2021-07-11 NOTE — ED Notes (Signed)
Report given to Melissa RN

## 2021-07-11 NOTE — H&P (Signed)
Patricia Craig is an 25 y.o. female. G2P1001 Patient's last menstrual period was 04/18/2021 (exact date). [id number] for management of hyperemesis gravidarum. She has had nausea and vomiting for weeks and last ate a small amount 2 days ago. She presented to ED today and received fluids, K+ supplement and antiemetic and still is nauseated.  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    Past Medical History:  Diagnosis Date   Anxiety    Herpes simplex type 2 infection     Past Surgical History:  Procedure Laterality Date   MOUTH SURGERY      No family history on file.  Social History:  reports that she has never smoked. She has never used smokeless tobacco. She reports that she does not currently use drugs after having used the following drugs: Marijuana. She reports that she does not drink alcohol.  Allergies: No Known Allergies  Medications Prior to Admission  Medication Sig Dispense Refill Last Dose   aspirin EC 81 MG tablet Take 1 tablet (81 mg total) by mouth daily. Swallow whole. 30 tablet 11    Blood Pressure Monitoring (BLOOD PRESSURE KIT) DEVI 1 Device by Does not apply route as needed. (Patient not taking: Reported on 07/05/2021) 1 each 0    Doxylamine-Pyridoxine 10-10 MG TBEC Take 2 tablets by mouth at bedtime. 60 tablet 1    famotidine (PEPCID) 20 MG tablet Take 1 tablet (20 mg total) by mouth 2 (two) times daily. 10 tablet 0    glycopyrrolate (ROBINUL) 1 MG tablet Take 2 tablets (2 mg total) by mouth 3 (three) times daily. 30 tablet 0    Misc. Devices (GOJJI WEIGHT SCALE) MISC 1 Device by Does not apply route as needed. (Patient not taking: Reported on 07/05/2021) 1 each 0    ondansetron (ZOFRAN-ODT) 8 MG disintegrating tablet Take 1 tablet (8 mg total) by mouth every 8 (eight) hours as needed for nausea or  vomiting. 20 tablet 0    Prenatal Vit-Fe Fumarate-FA (PRENATAL MULTIVITAMIN) TABS tablet Take 1 tablet by mouth daily at 12 noon. (Patient not taking: Reported on 07/05/2021)      promethazine (PHENERGAN) 25 MG tablet Take 1 tablet (25 mg total) by mouth every 6 (six) hours as needed for nausea or vomiting. 60 tablet 1    scopolamine (TRANSDERM-SCOP) 1 MG/3DAYS Place 1 patch (1.5 mg total) onto the skin every 3 (three) days. 10 patch 12     Review of Systems  Blood pressure 119/72, pulse 88, temperature 98.2 F (36.8 C), temperature source Oral, resp. rate 16, height '5\' 2"'  (1.575 m), weight 44.6 kg, last menstrual period 04/18/2021, SpO2 98 %, unknown if currently breastfeeding. Physical Exam Constitutional:      Appearance: She is ill-appearing.     Comments: Appears underweight  HENT:     Head: Normocephalic and atraumatic.  Cardiovascular:     Rate and Rhythm: Normal rate and regular rhythm.  Pulmonary:     Effort: Pulmonary effort is normal.  Abdominal:     General: Abdomen is flat.     Palpations: Abdomen is soft. There is no mass.  Skin:    General: Skin is warm and dry.  Neurological:     Mental Status: She is alert.  Psychiatric:        Mood and Affect: Mood normal.        Behavior:  Behavior normal.    Results for orders placed or performed during the hospital encounter of 07/11/21 (from the past 24 hour(s))  Basic metabolic panel     Status: Abnormal   Collection Time: 07/11/21 12:15 PM  Result Value Ref Range   Sodium 136 135 - 145 mmol/L   Potassium 2.4 (LL) 3.5 - 5.1 mmol/L   Chloride 96 (L) 98 - 111 mmol/L   CO2 26 22 - 32 mmol/L   Glucose, Bld 102 (H) 70 - 99 mg/dL   BUN 11 6 - 20 mg/dL   Creatinine, Ser 0.80 0.44 - 1.00 mg/dL   Calcium 9.2 8.9 - 10.3 mg/dL   GFR, Estimated >60 >60 mL/min   Anion gap 14 5 - 15  CBC with Differential     Status: Abnormal   Collection Time: 07/11/21 12:15 PM  Result Value Ref Range   WBC 3.7 (L) 4.0 - 10.5 K/uL   RBC 4.86  3.87 - 5.11 MIL/uL   Hemoglobin 12.7 12.0 - 15.0 g/dL   HCT 38.6 36.0 - 46.0 %   MCV 79.4 (L) 80.0 - 100.0 fL   MCH 26.1 26.0 - 34.0 pg   MCHC 32.9 30.0 - 36.0 g/dL   RDW 16.8 (H) 11.5 - 15.5 %   Platelets 162 150 - 400 K/uL   nRBC 0.0 0.0 - 0.2 %   Neutrophils Relative % 57 %   Neutro Abs 2.1 1.7 - 7.7 K/uL   Lymphocytes Relative 30 %   Lymphs Abs 1.1 0.7 - 4.0 K/uL   Monocytes Relative 10 %   Monocytes Absolute 0.4 0.1 - 1.0 K/uL   Eosinophils Relative 1 %   Eosinophils Absolute 0.0 0.0 - 0.5 K/uL   Basophils Relative 1 %   Basophils Absolute 0.0 0.0 - 0.1 K/uL   Immature Granulocytes 1 %   Abs Immature Granulocytes 0.03 0.00 - 0.07 K/uL  hCG, quantitative, pregnancy     Status: Abnormal   Collection Time: 07/11/21 12:15 PM  Result Value Ref Range   hCG, Beta Chain, Quant, S 125,053 (H) <5 mIU/mL  Magnesium     Status: None   Collection Time: 07/11/21 12:15 PM  Result Value Ref Range   Magnesium 2.0 1.7 - 2.4 mg/dL  Resp Panel by RT-PCR (Flu A&B, Covid) Nasopharyngeal Swab     Status: None   Collection Time: 07/11/21  2:35 PM   Specimen: Nasopharyngeal Swab; Nasopharyngeal(NP) swabs in vial transport medium  Result Value Ref Range   SARS Coronavirus 2 by RT PCR NEGATIVE NEGATIVE   Influenza A by PCR NEGATIVE NEGATIVE   Influenza B by PCR NEGATIVE NEGATIVE    No results found.  Assessment/Plan: Hyperemesis gravidarum for hospital management with fluids, antiemetic and steroids and will consider feeding tube if not improved tomorrow as [facility] team will be available 12/28  Emeterio Reeve 07/11/2021, 9:49 PM

## 2021-07-11 NOTE — ED Notes (Signed)
Pt given some ginger ale , room straightened

## 2021-07-11 NOTE — ED Notes (Signed)
Report given to [facility] Amanda °

## 2021-07-11 NOTE — ED Provider Notes (Signed)
[facility] EMERGENCY DEPARTMENT Provider Note   CSN: [id number] Arrival date & time: 07/11/21  1148     History Chief Complaint  Patient presents with   Emesis    Rakia Frayne Mealor is a 25 y.o. female.  HPI  Patient presents with emesis.  Patient is [redacted] weeks pregnant, she has been unable to keep any food down for the last day.  Last few weeks her nausea and vomiting has been well controlled with outpatient medicine, she still reports she is on 6 different types.  Denies any abdominal pain or vaginal bleeding.  Reports loss of weight during this pregnancy.  Past Medical History:  Diagnosis Date   Anxiety    Herpes simplex type 2 infection     Patient Active Problem List   Diagnosis Date Noted   Supervision of high risk pregnancy, antepartum 06/28/2021   Hyperthyroidism affecting pregnancy in first trimester 06/20/2021   Hypomagnesemia 06/20/2021   Hypokalemia 06/20/2021   Protein-calorie malnutrition, severe ([place]) 06/20/2021   Hyperemesis gravidarum 06/17/2021   Genital herpes simplex 04/27/2017    Past Surgical History:  Procedure Laterality Date   MOUTH SURGERY       OB History     Gravida  2   Para  1   Term  1   Preterm      AB      Living  1      SAB      IAB      Ectopic      Multiple  0   Live Births  1           No family history on file.  Social History   Tobacco Use   Smoking status: Never   Smokeless tobacco: Never  Vaping Use   Vaping Use: Never used  Substance Use Topics   Alcohol use: No   Drug use: Not Currently    Types: Marijuana    Comment: 2021    Home Medications Prior to Admission medications   Medication Sig Start Date End Date Taking? Authorizing Provider  aspirin EC 81 MG tablet Take 1 tablet (81 mg total) by mouth daily. Swallow whole. 07/05/21   Starr Lake, CNM  Blood Pressure Monitoring (BLOOD PRESSURE KIT) DEVI 1 Device by Does not apply route as  needed. Patient not taking: Reported on 07/05/2021 06/28/21   Starr Lake, CNM  Doxylamine-Pyridoxine 10-10 MG TBEC Take 2 tablets by mouth at bedtime. 06/30/21 07/30/21  Mickie Hillier, PA-C  famotidine (PEPCID) 20 MG tablet Take 1 tablet (20 mg total) by mouth 2 (two) times daily. 12/29/19   Jacqlyn Larsen, PA-C  glycopyrrolate (ROBINUL) 1 MG tablet Take 2 tablets (2 mg total) by mouth 3 (three) times daily. 06/21/21   Chancy Milroy, MD  Misc. Devices (GOJJI WEIGHT SCALE) MISC 1 Device by Does not apply route as needed. Patient not taking: Reported on 07/05/2021 06/28/21   Starr Lake, CNM  ondansetron (ZOFRAN-ODT) 8 MG disintegrating tablet Take 1 tablet (8 mg total) by mouth every 8 (eight) hours as needed for nausea or vomiting. 06/30/21   Mickie Hillier, PA-C  Prenatal Vit-Fe Fumarate-FA (PRENATAL MULTIVITAMIN) TABS tablet Take 1 tablet by mouth daily at 12 noon. Patient not taking: Reported on 07/05/2021    [provider]  promethazine (PHENERGAN) 25 MG tablet Take 1 tablet (25 mg total) by mouth every 6 (six) hours as needed for nausea or vomiting. 07/05/21  Jorje Guild, NP  scopolamine (TRANSDERM-SCOP) 1 MG/3DAYS Place 1 patch (1.5 mg total) onto the skin every 3 (three) days. 06/30/21   Mickie Hillier, PA-C    Allergies    Patient has no known allergies.  Review of Systems   Review of Systems  Constitutional:  Positive for appetite change and unexpected weight change. Negative for chills and fever.  HENT:  Negative for ear pain and sore throat.   Eyes:  Negative for pain and visual disturbance.  Respiratory:  Negative for cough and shortness of breath.   Cardiovascular:  Negative for chest pain and palpitations.  Gastrointestinal:  Positive for nausea and vomiting. Negative for abdominal pain.  Genitourinary:  Negative for dysuria, hematuria and vaginal bleeding.  Musculoskeletal:  Negative for arthralgias and back pain.  Skin:   Negative for color change and rash.  Neurological:  Negative for seizures and syncope.  All other systems reviewed and are negative.  Physical Exam Updated Vital Signs BP 104/87 (BP Location: Left Arm)    Pulse (!) 118    Temp 98.2 F (36.8 C) (Oral)    Resp 16    Ht 5' 2" (1.575 m)    Wt 44.6 kg    LMP 04/18/2021 (Exact Date)    SpO2 96%    BMI 18.00 kg/m   Physical Exam Vitals and nursing note reviewed. Exam conducted with a chaperone present.  Constitutional:      Appearance: Normal appearance.  HENT:     Head: Normocephalic and atraumatic.     Mouth/Throat:     Mouth: Mucous membranes are dry.  Eyes:     General: No scleral icterus.       Right eye: No discharge.        Left eye: No discharge.     Extraocular Movements: Extraocular movements intact.     Pupils: Pupils are equal, round, and reactive to light.  Cardiovascular:     Rate and Rhythm: Regular rhythm. Tachycardia present.     Pulses: Normal pulses.     Heart sounds: Normal heart sounds. No murmur heard.   No friction rub. No gallop.  Pulmonary:     Effort: Pulmonary effort is normal. No respiratory distress.     Breath sounds: Normal breath sounds.  Abdominal:     General: Abdomen is flat. Bowel sounds are normal. There is no distension.     Palpations: Abdomen is soft.     Tenderness: There is no abdominal tenderness.  Skin:    General: Skin is warm and dry.     Coloration: Skin is pale. Skin is not jaundiced.  Neurological:     Mental Status: She is alert. Mental status is at baseline.     Coordination: Coordination normal.    ED Results / Procedures / Treatments   Labs (all labs ordered are listed, but only abnormal results are displayed) Labs Reviewed  BASIC METABOLIC PANEL  CBC WITH DIFFERENTIAL/PLATELET  HCG, QUANTITATIVE, PREGNANCY  MAGNESIUM    EKG None  Radiology No results found.  Procedures Procedures   Medications Ordered in ED Medications  pyridOXINE (B-6) injection 100 mg  (has no administration in time range)  sodium chloride 0.9 % bolus 1,000 mL (has no administration in time range)  ondansetron (ZOFRAN) injection 4 mg (has no administration in time range)    ED Course  I have reviewed the triage vital signs and the nursing notes.  Pertinent labs & imaging results that were available during my care of  the patient were reviewed by me and considered in my medical decision making (see chart for details).    MDM Rules/Calculators/A&P                         This is a 25 year old female who was [redacted] weeks gestational age.  She is tachycardic but not hypoxic.  She does appear dry, fluids B6 and Zofran given for the hyperemesis gravidarum.  We will continue to rehydrate and check some basic labs.  She denies any abdominal pain or vaginal pain, doubt cause miscarriage.  Patient is hypokalemic at 2.4.  IV potassium supplementation given.  Patient is still nauseated, not tolerating oral intake.  We will plan on admission for generalized weakness, hypokalemia, hyperemesis gravidarum.  Spoke with Dr. Nehemiah Settle with OB/GYN who agrees with admission.  I appreciate his help in coordinating care for this patient.     Final Clinical Impression(s) / ED Diagnoses Final diagnoses:  None    Rx / DC Orders ED Discharge Orders     None        Sherrill Raring, Hershal Coria 07/11/21 1951    Luna Fuse, MD 07/14/21 [phone]

## 2021-07-11 NOTE — ED Notes (Signed)
Called to give report, RN busy at the time

## 2021-07-12 ENCOUNTER — Encounter (HOSPITAL_COMMUNITY)
Admission: EM | Disposition: A | Discharge status: Home / Self Care | Payer: Self-pay | Source: Home / Self Care | Attending: Obstetrics & Gynecology

## 2021-07-12 ENCOUNTER — Inpatient Hospital Stay (HOSPITAL_COMMUNITY): Payer: Medicaid Other | Admitting: Certified Registered"

## 2021-07-12 ENCOUNTER — Inpatient Hospital Stay (HOSPITAL_COMMUNITY): Payer: Medicaid Other

## 2021-07-12 ENCOUNTER — Encounter (HOSPITAL_COMMUNITY): Payer: Self-pay | Admitting: Obstetrics & Gynecology

## 2021-07-12 ENCOUNTER — Other Ambulatory Visit: Payer: Self-pay | Admitting: Obstetrics & Gynecology

## 2021-07-12 DIAGNOSIS — O021 Missed abortion: Secondary | ICD-10-CM

## 2021-07-12 HISTORY — PX: DILATION AND EVACUATION: SHX1459

## 2021-07-12 LAB — GLUCOSE, CAPILLARY: Glucose-Capillary: 98 mg/dL (ref 70–99)

## 2021-07-12 LAB — BASIC METABOLIC PANEL
Anion gap: 11 (ref 5–15)
BUN: 5 mg/dL — ABNORMAL LOW (ref 6–20)
CO2: 16 mmol/L — ABNORMAL LOW (ref 22–32)
Calcium: 8.6 mg/dL — ABNORMAL LOW (ref 8.9–10.3)
Chloride: 108 mmol/L (ref 98–111)
Creatinine, Ser: 0.49 mg/dL (ref 0.44–1.00)
GFR, Estimated: >60 mL/min (ref >60)
Glucose, Bld: 78 mg/dL (ref 70–99)
Potassium: 3.7 mmol/L (ref 3.5–5.1)
Sodium: 135 mmol/L (ref 135–145)

## 2021-07-12 LAB — CBC
HCT: 29.1 % — ABNORMAL LOW (ref 36.0–46.0)
Hemoglobin: 9.3 g/dL — ABNORMAL LOW (ref 12.0–15.0)
MCH: 26 pg (ref 26.0–34.0)
MCHC: 32 g/dL (ref 30.0–36.0)
MCV: 81.3 fL (ref 80.0–100.0)
Platelets: 129 10*3/uL — ABNORMAL LOW (ref 150–400)
RBC: 3.58 MIL/uL — ABNORMAL LOW (ref 3.87–5.11)
RDW: 16.7 % — ABNORMAL HIGH (ref 11.5–15.5)
WBC: 4.7 10*3/uL (ref 4.0–10.5)
nRBC: 0 % (ref 0.0–0.2)

## 2021-07-12 LAB — TYPE AND SCREEN
ABO/RH(D): O POS
Antibody Screen: NEGATIVE

## 2021-07-12 LAB — GLUCOSE, RANDOM: Glucose, Bld: 90 mg/dL (ref 70–99)

## 2021-07-12 SURGERY — DILATION AND EVACUATION, UTERUS
Anesthesia: General | Site: Vagina

## 2021-07-12 MED ORDER — FENTANYL CITRATE (PF) 250 MCG/5ML IJ SOLN
INTRAMUSCULAR | Status: DC | PRN
  Administered 2021-07-12: 100 ug via INTRAVENOUS
  Administered 2021-07-12 (×2): 50 ug via INTRAVENOUS

## 2021-07-12 MED ORDER — ACETAMINOPHEN 500 MG PO TABS
ORAL_TABLET | ORAL | Status: AC
  Filled 2021-07-12: qty 2

## 2021-07-12 MED ORDER — PROPOFOL 10 MG/ML IV BOLUS
INTRAVENOUS | Status: DC | PRN
  Administered 2021-07-12: 30 mg via INTRAVENOUS
  Administered 2021-07-12: 110 mg via INTRAVENOUS

## 2021-07-12 MED ORDER — HYDROCODONE-ACETAMINOPHEN 5-325 MG PO TABS: 1.0000 | ORAL_TABLET | Freq: Four times a day (QID) | ORAL | 0 refills | Status: DC | PRN

## 2021-07-12 MED ORDER — PROPOFOL 10 MG/ML IV BOLUS
INTRAVENOUS | Status: AC
  Filled 2021-07-12: qty 20

## 2021-07-12 MED ORDER — CHLORHEXIDINE GLUCONATE 0.12 % MT SOLN
15.0000 mL | Freq: Once | OROMUCOSAL | Status: AC
  Administered 2021-07-12: 14:00:00 15 mL via OROMUCOSAL

## 2021-07-12 MED ORDER — MIDAZOLAM HCL 2 MG/2ML IJ SOLN
INTRAMUSCULAR | Status: AC
  Filled 2021-07-12: qty 2

## 2021-07-12 MED ORDER — LIDOCAINE 2% (20 MG/ML) 5 ML SYRINGE
INTRAMUSCULAR | Status: DC | PRN
  Administered 2021-07-12: 40 mg via INTRAVENOUS

## 2021-07-12 MED ORDER — KETOROLAC TROMETHAMINE 30 MG/ML IJ SOLN
INTRAMUSCULAR | Status: DC | PRN
  Administered 2021-07-12: 30 mg via INTRAVENOUS

## 2021-07-12 MED ORDER — ACETAMINOPHEN 500 MG PO TABS
1000.0000 mg | ORAL_TABLET | Freq: Once | ORAL | Status: AC
  Administered 2021-07-12: 14:00:00 1000 mg via ORAL

## 2021-07-12 MED ORDER — 0.9 % SODIUM CHLORIDE (POUR BTL) OPTIME
TOPICAL | Status: DC | PRN
  Administered 2021-07-12: 15:00:00 1000 mL

## 2021-07-12 MED ORDER — LACTATED RINGERS IV SOLN: INTRAVENOUS | Status: DC

## 2021-07-12 MED ORDER — ORAL CARE MOUTH RINSE: 15.0000 mL | Freq: Once | OROMUCOSAL | Status: AC

## 2021-07-12 MED ORDER — CHLORHEXIDINE GLUCONATE 0.12 % MT SOLN
OROMUCOSAL | Status: AC
  Filled 2021-07-12: qty 15

## 2021-07-12 MED ORDER — CEFAZOLIN SODIUM-DEXTROSE 2-3 GM-%(50ML) IV SOLR
INTRAVENOUS | Status: DC | PRN
  Administered 2021-07-12: 2 g via INTRAVENOUS

## 2021-07-12 MED ORDER — METHYLPREDNISOLONE SODIUM SUCC 40 MG IJ SOLR
16.0000 mg | Freq: Three times a day (TID) | INTRAMUSCULAR | Status: DC
  Filled 2021-07-12 (×2): qty 0.4

## 2021-07-12 MED ORDER — ROCURONIUM BROMIDE 10 MG/ML (PF) SYRINGE
PREFILLED_SYRINGE | INTRAVENOUS | Status: AC
  Filled 2021-07-12: qty 10

## 2021-07-12 MED ORDER — PHENYLEPHRINE 40 MCG/ML (10ML) SYRINGE FOR IV PUSH (FOR BLOOD PRESSURE SUPPORT)
PREFILLED_SYRINGE | INTRAVENOUS | Status: AC
  Filled 2021-07-12: qty 10

## 2021-07-12 MED ORDER — BUPIVACAINE HCL (PF) 0.5 % IJ SOLN
INTRAMUSCULAR | Status: AC
  Filled 2021-07-12: qty 30

## 2021-07-12 MED ORDER — SUCCINYLCHOLINE CHLORIDE 200 MG/10ML IV SOSY
PREFILLED_SYRINGE | INTRAVENOUS | Status: DC | PRN
  Administered 2021-07-12: 60 mg via INTRAVENOUS

## 2021-07-12 MED ORDER — METHYLERGONOVINE MALEATE 0.2 MG/ML IJ SOLN
INTRAMUSCULAR | Status: AC
  Filled 2021-07-12: qty 1

## 2021-07-12 MED ORDER — ONDANSETRON HCL 4 MG/2ML IJ SOLN
INTRAMUSCULAR | Status: DC | PRN
  Administered 2021-07-12: 4 mg via INTRAVENOUS

## 2021-07-12 MED ORDER — DEXAMETHASONE SODIUM PHOSPHATE 10 MG/ML IJ SOLN
INTRAMUSCULAR | Status: DC | PRN
  Administered 2021-07-12: 10 mg via INTRAVENOUS

## 2021-07-12 MED ORDER — PHENYLEPHRINE 40 MCG/ML (10ML) SYRINGE FOR IV PUSH (FOR BLOOD PRESSURE SUPPORT)
PREFILLED_SYRINGE | INTRAVENOUS | Status: DC | PRN
  Administered 2021-07-12 (×2): 120 ug via INTRAVENOUS

## 2021-07-12 MED ORDER — FENTANYL CITRATE (PF) 100 MCG/2ML IJ SOLN: 25.0000 ug | INTRAMUSCULAR | Status: DC | PRN

## 2021-07-12 MED ORDER — FENTANYL CITRATE (PF) 250 MCG/5ML IJ SOLN
INTRAMUSCULAR | Status: AC
  Filled 2021-07-12: qty 5

## 2021-07-12 MED ORDER — MISOPROSTOL 200 MCG PO TABS: 400.0000 ug | ORAL_TABLET | Freq: Three times a day (TID) | ORAL | 0 refills | Status: DC

## 2021-07-12 MED ORDER — MISOPROSTOL 200 MCG PO TABS
ORAL_TABLET | ORAL | Status: AC
  Filled 2021-07-12: qty 4

## 2021-07-12 MED ORDER — METHYLERGONOVINE MALEATE 0.2 MG/ML IJ SOLN
INTRAMUSCULAR | Status: DC | PRN
  Administered 2021-07-12: .2 mg via INTRAMUSCULAR

## 2021-07-12 SURGICAL SUPPLY — 21 items
CATH ROBINSON RED A/P 16FR (CATHETERS) ×3 IMPLANT
DECANTER SPIKE VIAL GLASS SM (MISCELLANEOUS) ×3 IMPLANT
FILTER UTR ASPR ASSEMBLY (MISCELLANEOUS) ×3 IMPLANT
GLOVE SRG 8 PF TXTR STRL LF DI (GLOVE) ×1 IMPLANT
GLOVE SURG LTX SZ8 (GLOVE) ×3 IMPLANT
GLOVE SURG UNDER POLY LF SZ7 (GLOVE) ×3 IMPLANT
GLOVE SURG UNDER POLY LF SZ8 (GLOVE) ×2
GOWN STRL REUS W/ TWL LRG LVL3 (GOWN DISPOSABLE) ×2 IMPLANT
GOWN STRL REUS W/TWL LRG LVL3 (GOWN DISPOSABLE) ×4
KIT BERKELEY 1ST TRI 3/8 NO TR (MISCELLANEOUS) ×3 IMPLANT
KIT BERKELEY 1ST TRIMESTER 3/8 (MISCELLANEOUS) ×3 IMPLANT
NS IRRIG 1000ML POUR BTL (IV SOLUTION) ×3 IMPLANT
PACK VAGINAL MINOR WOMEN LF (CUSTOM PROCEDURE TRAY) ×3 IMPLANT
PAD OB MATERNITY 4.3X12.25 (PERSONAL CARE ITEMS) ×3 IMPLANT
SET BERKELEY SUCTION TUBING (SUCTIONS) ×3 IMPLANT
TOWEL GREEN STERILE FF (TOWEL DISPOSABLE) ×6 IMPLANT
UNDERPAD 30X36 HEAVY ABSORB (UNDERPADS AND DIAPERS) ×3 IMPLANT
VACURETTE 10 RIGID CVD (CANNULA) IMPLANT
VACURETTE 7MM CVD STRL WRAP (CANNULA) IMPLANT
VACURETTE 8 RIGID CVD (CANNULA) IMPLANT
VACURETTE 9 RIGID CVD (CANNULA) IMPLANT

## 2021-07-12 NOTE — Op Note (Signed)
Preoperative diagnosis:  [redacted]w[redacted]d week pregnancy loss                                           Hyperemesis gravidarum  Postoperative diagnosis:  Same as above  Procedure:  Cervical dilation with suction and sharp uterine curettage  Surgeon:  Lazaro Arms  Anesthesia:  GET  Findings:  patient was admitted for hyperemesis gravidarum last evening.  No FHT had been obtained I went to do a sonogram this am and found no FCA which was confirmed by formal sonographic evaluation CRL [redacted]w[redacted]d No etiology is known  Description of operation:  The patient was taken to the operating room and placed in the supine position.  She underwent laryngeal mask airway general anesthesia.  The patient was placed in the dorsal lithotomy position.  The vagina was prepped and draped in the usual sterile fashion.  A Graves speculum was placed.  The anterior cervix was grasped with a single-tooth tenaculum.  The cervix was dilated serially with Hegar dilators.  A #12 curved suction curette was placed in the uterus.  The suction pressure was placed at 55 and several passes were made.  All of the intrauterine contents were removed.  The sharp curette was used x1 to feel uterine crie in all areas.  The patient was given Methergine 0.2 mg IV x1.  There was good hemostasis.  The patient was given ancef 2 grams preoperatively.  The patient was given Toradol 30 mg IV preoperatively.  Estimated blood loss for the procedure was 400 cc.  The patient was awakened from anesthesia taken to the recovery room in good stable condition.  All counts were correct x3.  I also placed cytotec 800 mcg per rectum prior to leaving the OR  Lazaro Arms, MD 07/12/2021 3:56 PM

## 2021-07-12 NOTE — Anesthesia Preprocedure Evaluation (Addendum)
Anesthesia Evaluation  Patient identified by MRN, date of birth, ID band Patient awake    Reviewed: Allergy & Precautions, NPO status , Patient's Chart, lab work & pertinent test results  Airway Mallampati: I  TM Distance: >3 FB Neck ROM: Full    Dental  (+) Dental Advisory Given, Teeth Intact   Pulmonary neg pulmonary ROS,    Pulmonary exam normal breath sounds clear to auscultation       Cardiovascular negative cardio ROS Normal cardiovascular exam Rhythm:Regular Rate:Normal     Neuro/Psych PSYCHIATRIC DISORDERS Anxiety negative neurological ROS     GI/Hepatic negative GI ROS, (+)     substance abuse  marijuana use,   Endo/Other  negative endocrine ROS  Renal/GU negative Renal ROS  negative genitourinary   Musculoskeletal negative musculoskeletal ROS (+)   Abdominal   Peds  Hematology  (+) Blood dyscrasia, anemia , Lab Results      Component                Value               Date                      WBC                      4.7                 07/12/2021                HGB                      9.3 (L)             07/12/2021                HCT                      29.1 (L)            07/12/2021                MCV                      81.3                07/12/2021                PLT                      129 (L)             07/12/2021              Anesthesia Other Findings Missed abortion at [redacted] weeks gestation  Reproductive/Obstetrics                           Anesthesia Physical Anesthesia Plan  ASA: 2 and emergent  Anesthesia Plan: General   Post-op Pain Management: Minimal or no pain anticipated and Tylenol PO (pre-op)   Induction: Intravenous, Rapid sequence and Cricoid pressure planned  PONV Risk Score and Plan: 4 or greater and Midazolam, Dexamethasone, Ondansetron, Treatment may vary due to age or medical condition and Diphenhydramine  Airway Management Planned: Oral  ETT  Additional Equipment: None  Intra-op Plan:   Post-operative Plan: Extubation in OR  Informed Consent: I have reviewed the  patients History and Physical, chart, labs and discussed the procedure including the risks, benefits and alternatives for the proposed anesthesia with the patient or authorized representative who has indicated his/her understanding and acceptance.     Dental advisory given  Plan Discussed with: CRNA  Anesthesia Plan Comments:       Anesthesia Quick Evaluation

## 2021-07-12 NOTE — Transfer of Care (Signed)
Immediate Anesthesia Transfer of Care Note  Patient: Patricia Craig  Procedure(s) Performed: DILATATION AND EVACUATION (Vagina )  Patient Location: PACU  Anesthesia Type:General  Level of Consciousness: drowsy and patient cooperative  Airway & Oxygen Therapy: Patient Spontanous Breathing  Post-op Assessment: Report given to RN and Post -op Vital signs reviewed and stable  Post vital signs: Reviewed and stable  Last Vitals:  Vitals Value Taken Time  BP 116/72 07/12/21 1545  Temp    Pulse 101 07/12/21 1547  Resp 17 07/12/21 1547  SpO2 100 % 07/12/21 1547  Vitals shown include unvalidated device data.  Last Pain:  Vitals:   07/12/21 1424  TempSrc: Oral  PainSc: 0-No pain         Complications: No notable events documented.

## 2021-07-12 NOTE — Progress Notes (Signed)
°  Patient ID: VIRGENE TIRONE, female   DOB: Jan 29, 1996, 25 y.o.   MRN: [id number]  I went in to assess the patient this am and was informed that FHT had not been obtained on the patient since presentation/admission  I performed a bedside sonogram and found no fetal cardiac activity Pt denies any bleeding or other issue Will order sonogram for coumentation within Epic Pt is aware  Will plan on D&C for management  Lazaro Arms, MD 07/12/2021 9:53 AM

## 2021-07-12 NOTE — Progress Notes (Signed)
Report given to Kandice Hams, RN

## 2021-07-12 NOTE — Anesthesia Procedure Notes (Signed)
Procedure Name: Intubation Date/Time: 07/12/2021 3:00 PM Performed by: Rosiland Oz, CRNA Pre-anesthesia Checklist: Patient identified, Emergency Drugs available, Suction available, Patient being monitored and Timeout performed Patient Re-evaluated:Patient Re-evaluated prior to induction Oxygen Delivery Method: Circle system utilized Preoxygenation: Pre-oxygenation with 100% oxygen Induction Type: IV induction, Rapid sequence and Cricoid Pressure applied Laryngoscope Size: Miller and 3 Grade View: Grade I Tube type: Oral Tube size: 7.0 mm Number of attempts: 1 Airway Equipment and Method: Stylet Placement Confirmation: ETT inserted through vocal cords under direct vision, positive ETCO2 and breath sounds checked- equal and bilateral Secured at: 21 cm Tube secured with: Tape Dental Injury: Teeth and Oropharynx as per pre-operative assessment

## 2021-07-12 NOTE — Progress Notes (Signed)
Unable to assess fetal heart tones with second RN. MD notified at 2330 and this RN requested bedside ultrasound. MD re-notified of bedside ultrasound at 0145. MD stated will return in the morning to assess pt. Pt. Complained new onset of abd. cramping at 0330. Called C. Mathis Fare, MD to request a bedside ultrasound at 0409. Resident consulted with Dr. Debroah Loop and stated bedside ultrasound will be done the morning of 07/12/2021.

## 2021-07-12 NOTE — Anesthesia Postprocedure Evaluation (Signed)
Anesthesia Post Note  Patient: Patricia Craig  Procedure(s) Performed: DILATATION AND EVACUATION (Vagina )     Patient location during evaluation: PACU Anesthesia Type: General Level of consciousness: sedated and patient cooperative Pain management: pain level controlled Vital Signs Assessment: post-procedure vital signs reviewed and stable Respiratory status: spontaneous breathing Cardiovascular status: stable Anesthetic complications: no   No notable events documented.  Last Vitals:  Vitals:   07/12/21 1615 07/12/21 1630  BP: 121/84 (!) 117/95  Pulse: 89 76  Resp: 20 19  Temp:    SpO2: 100% 100%    Last Pain:  Vitals:   07/12/21 1615  TempSrc:   PainSc: 0-No pain                 Lewie Loron

## 2021-07-13 ENCOUNTER — Encounter (HOSPITAL_COMMUNITY): Payer: Self-pay | Admitting: Obstetrics & Gynecology

## 2021-07-13 LAB — SURGICAL PATHOLOGY

## 2021-07-14 ENCOUNTER — Encounter: Payer: Self-pay | Admitting: Student

## 2021-07-21 ENCOUNTER — Encounter: Payer: Self-pay | Admitting: General Practice

## 2021-07-22 ENCOUNTER — Telehealth: Payer: Self-pay | Admitting: Lactation Services

## 2021-07-22 NOTE — Telephone Encounter (Signed)
Called patient with results of Kennedy showing she is a silent carrier for Alpha Thalassemia. Patient has a missed AB and D&E so no longer pregnant.   Was not able to reach patient. LM that I cal calling with non urgent test results and asked her to check My Chart message. My Chart Message sent.

## 2021-07-22 NOTE — Progress Notes (Signed)
Chart reviewed for nurse visit. Agree with plan of care.   Marylene Land, CNM 07/22/2021 11:39 PM

## 2021-07-23 NOTE — Progress Notes (Signed)
Chart reviewed for nurse visit. Agree with plan of care.   Starr Lake, [place] 07/23/2021 3:19 AM

## 2021-07-27 ENCOUNTER — Other Ambulatory Visit (HOSPITAL_BASED_OUTPATIENT_CLINIC_OR_DEPARTMENT_OTHER): Payer: Self-pay

## 2021-08-03 ENCOUNTER — Encounter: Payer: Self-pay | Admitting: Nurse Practitioner

## 2021-08-03 ENCOUNTER — Other Ambulatory Visit (HOSPITAL_COMMUNITY)
Admission: RE | Admit: 2021-08-03 | Discharge: 2021-08-03 | Disposition: A | Discharge status: Home / Self Care | Payer: Medicaid Other | Source: Ambulatory Visit | Attending: Nurse Practitioner | Admitting: Nurse Practitioner

## 2021-08-03 ENCOUNTER — Other Ambulatory Visit: Payer: Self-pay

## 2021-08-03 ENCOUNTER — Ambulatory Visit (INDEPENDENT_AMBULATORY_CARE_PROVIDER_SITE_OTHER): Payer: Medicaid Other | Admitting: Nurse Practitioner

## 2021-08-03 VITALS — BP 116/78 | HR 78 | Wt 115.0 lb

## 2021-08-03 DIAGNOSIS — G4700 Insomnia, unspecified: Secondary | ICD-10-CM | POA: Insufficient documentation

## 2021-08-03 DIAGNOSIS — N898 Other specified noninflammatory disorders of vagina: Secondary | ICD-10-CM | POA: Diagnosis not present

## 2021-08-03 DIAGNOSIS — Z3009 Encounter for other general counseling and advice on contraception: Secondary | ICD-10-CM | POA: Diagnosis not present

## 2021-08-03 DIAGNOSIS — O039 Complete or unspecified spontaneous abortion without complication: Secondary | ICD-10-CM | POA: Diagnosis not present

## 2021-08-03 DIAGNOSIS — O019 Hydatidiform mole, unspecified: Secondary | ICD-10-CM | POA: Diagnosis not present

## 2021-08-03 DIAGNOSIS — O021 Missed abortion: Secondary | ICD-10-CM

## 2021-08-03 DIAGNOSIS — Z5189 Encounter for other specified aftercare: Secondary | ICD-10-CM | POA: Diagnosis not present

## 2021-08-03 LAB — POCT URINALYSIS DIP (DEVICE)
Bilirubin Urine: NEGATIVE
Glucose, UA: NEGATIVE mg/dL
Hgb urine dipstick: NEGATIVE
Ketones, ur: NEGATIVE mg/dL
Nitrite: NEGATIVE
Protein, ur: NEGATIVE mg/dL
Specific Gravity, Urine: >=1.03 (ref 1.005–1.030)
Urobilinogen, UA: 0.2 mg/dL (ref 0.0–1.0)
pH: 6 (ref 5.0–8.0)

## 2021-08-03 NOTE — Discharge Summary (Signed)
Physician Discharge Summary  Patient ID: Patricia Craig MRN: [id number] DOB/AGE: Jul 15, 1996 26 y.o.  Admit date: 07/11/2021 Discharge date: 08/03/2021  Admission Diagnoses: Hyperemesis gravidarum at 12 weeks =gestation Discharge Diagnoses:  Principal Problem:   Hypokalemia Active Problems:   Hyperemesis gravidarum   Missed abortion   Discharged Condition: stable  Hospital Course: pt was admitted for hydration and electrolyte correction.  However she was noted to have a non viable pregnancy by my bedsisde sonogram.  Confirmed with PACS documented sonogram as well.  Underwent D&C without complications later that day and was discharged home  Consults: None  Significant Diagnostic Studies: radiology: Ultrasound:    Treatments: surgery: D&C  Discharge Exam: Blood pressure (!) 117/95, pulse 76, temperature (!) 97.3 F (36.3 C), resp. rate 19, height 5\' 2"  (1.575 m), weight 48.1 kg, last menstrual period 04/18/2021, SpO2 100 %. General appearance: alert, cooperative, and no distress GI: soft, non-tender; bowel sounds normal; no masses,  no organomegaly  Disposition: Discharge disposition: 01-Home or Self Care       Discharge Instructions     Call MD for:  persistant nausea and vomiting   Complete by: As directed    Call MD for:  severe uncontrolled pain   Complete by: As directed    Call MD for:  temperature >100.4   Complete by: As directed    Diet - low sodium heart healthy   Complete by: As directed    Driving Restrictions   Complete by: As directed    No driving today   Increase activity slowly   Complete by: As directed    Lifting restrictions   Complete by: As directed    No restrictions   No wound care   Complete by: As directed    Sexual Activity Restrictions   Complete by: As directed    No sex for 4 weeks      Allergies as of 07/12/2021   No Known Allergies      Medication List     STOP taking these medications    aspirin EC 81 MG  tablet   glycopyrrolate 1 MG tablet Commonly known as: ROBINUL       TAKE these medications    HYDROcodone-acetaminophen 5-325 MG tablet Commonly known as: NORCO/VICODIN Take 1 tablet by mouth every 6 (six) hours as needed.   ondansetron 8 MG disintegrating tablet Commonly known as: ZOFRAN-ODT Take 1 tablet (8 mg total) by mouth every 8 (eight) hours as needed for nausea or vomiting.   promethazine 25 MG tablet Commonly known as: PHENERGAN Take 1 tablet (25 mg total) by mouth every 6 (six) hours as needed for nausea or vomiting.   Transderm-Scop 1 MG/3DAYS Generic drug: scopolamine Place 1 patch (1.5 mg total) onto the skin every 3 (three) days.       ASK your doctor about these medications    Diclegis 10-10 MG Tbec Generic drug: Doxylamine-Pyridoxine Take 2 tablets by mouth at bedtime. Ask about: Should I take this medication?   misoprostol 200 MCG tablet Commonly known as: CYTOTEC Take 2 tablets (400 mcg total) by mouth 3 (three) times daily.        Follow-up Information     [facility] Follow up in 2 week(s).   Specialty: Obstetrics and Gynecology Why: post op visit Contact information: [address] [place] [phone]                Signed:  Lazaro Arms

## 2021-08-03 NOTE — Progress Notes (Signed)
Interested in birth control:  [facility]:

## 2021-08-03 NOTE — Progress Notes (Signed)
GYNECOLOGY OFFICE VISIT NOTE   History:  25 y.o. G2P1001 here today for follow up after fetal demise at 56 weeks and follow up from [facility]. 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  She did have severe hyperemesis needing hospitalization in first trimester, but has no vomiting or nausea now.  She denies any abnormal pelvic pain or other concerns. She is having old blood and requests testing for infection.  Reports dark urine but is only drinking 2 bottles of water daily.    Past Medical History:  Diagnosis Date   Anxiety    Herpes simplex type 2 infection     Past Surgical History:  Procedure Laterality Date   DILATION AND EVACUATION N/A 07/12/2021   Procedure: DILATATION AND EVACUATION;  Surgeon: Florian Buff, MD;  Location: [place];  Service: Gynecology;  Laterality: N/A;   MOUTH SURGERY      The following portions of the patient's history were reviewed and updated as appropriate: allergies, current medications, past family history, past medical history, past social history, past surgical history and problem list.   Health Maintenance:  Pap is due but was not done at today's visit.  Review of Systems:  Pertinent items noted in HPI and remainder of comprehensive ROS otherwise negative.  Objective:  Physical Exam BP 116/78    Pulse 78    Wt 115 lb (52.2 kg)    Breastfeeding No    BMI 21.03 kg/m  CONSTITUTIONAL: Well-developed, well-nourished female in no acute distress.  HENT:  Normocephalic, atraumatic. External right and left ear normal.  EYES: Conjunctivae and EOM are normal. Pupils are equal, round.  No scleral icterus.  NECK: Normal range of motion, supple SKIN: Skin is warm and dry. No rash noted. Not diaphoretic. No erythema. No pallor. NEUROLOGIC: Alert and oriented to person, place, and time. Normal muscle tone coordination. No cranial nerve deficit noted. PSYCHIATRIC: Normal mood and affect. Normal behavior. Normal  judgment and thought content. CARDIOVASCULAR: Normal heart rate noted RESPIRATORY: Effort and breath sounds normal, no problems with respiration noted PELVIC: Deferred MUSCULOSKELETAL: Normal range of motion. No edema noted.  Labs and Imaging US OB Comp Less 14 Wks  Result Date: 07/12/2021 CLINICAL DATA:  Viability, fetal heart tone not found EXAM: OBSTETRIC <14 WK ULTRASOUND TECHNIQUE: Transabdominal ultrasound was performed for evaluation of the gestation as well as the maternal uterus and adnexal regions. COMPARISON:  None. FINDINGS: Intrauterine gestational sac: Single Yolk sac:  Not Visualized. Embryo:  Visualized. Cardiac Activity: Not Visualized. Heart Rate: 0 bpm CRL: 59.1  mm   12 w 3 d                  [place] EDC: 01/21/2022 Subchorionic hemorrhage:  None visualized. Maternal uterus/adnexae: No adnexal abnormality. IMPRESSION: Single intrauterine pregnancy. Findings consistent with intrauterine fetal demise. Findings meet definitive criteria for failed pregnancy. This follows SRU consensus guidelines: Diagnostic Criteria for Nonviable Pregnancy Early in the First Trimester. Alison Stalling J Med [phone]. Electronically Signed   By: Kathreen Devoid M.D.   On: 07/12/2021 10:26    Assessment & Plan:  1. Follow-up visit after miscarriage Had a failed pregnancy with no FHT at [redacted]w[redacted]d.  Was hospitalized for severe hyperemesis.  Had a D&C and then the pathology report found trophoblastic hyperplasia.  See below. Advised more PO fluids than she is taking now. Will need Pap at another visit - did not do pap smear today.  - Beta hCG quant (ref lab)  2. Vaginal discharge Likely is old blood from [facility] but will check for infection  - Cervicovaginal ancillary only( [place])  3. 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  Highly suspicious for molar pregnancy.  Consult with Dr. Nehemiah Settle.  Follow up as  recommended by [facility] Brewing technologist) and [employer] Naval architect) recommend NOT becoming pregnant during period of evaluation and weekly [place] until <5 or zero AND then monthly BHCG x 3 more months to make sure levels are zero.  Of note:  patient had severe hyperemesis need hospitalization and did not have any nausea and vomiting in her first pregnancy.    Patient counseled extensively on not becoming pregnant.  Current partner is away and she is not having intercourse at this time.  Discussed using Depo as she has in the past but she does not want Depo today.  Verbally commits to no intercourse until the evaluation of a possible molar pregnancy is completed.  May want Depo at a next visit.  She confirms she does not use alcohol or drugs.  Discussed the possibility that alcohol or drug use may affect her decision making and her decision today is no intercourse until this evaluation is completed and it may take several months depending on the results of her labs.  She asked appropriate questions about this new diagnosis  4.  Family Planning Counseling NO pregnancy until possible molar pregnancy evaluation is completed. Considering depo.  5.  Insomnia Is not able to go to sleep.   Reviewed relaxation technique to use for sleep - she has not been able to relax her mind. Referred to in office behavioral health also.  Routine preventative health maintenance measures emphasized. Please refer to After Visit Summary for other counseling recommendations.   Return in about 1 week (around 08/10/2021) for lab appointment for blood work.   Total face-to-face time with patient: 15 minutes.  Over 50% of encounter was spent on counseling and coordination of care.  [name], RN, MSN, NP-BC Nurse Practitioner, [facility] for [employer], [facility] 08/03/2021 12:03 PM

## 2021-08-04 ENCOUNTER — Other Ambulatory Visit: Payer: Self-pay

## 2021-08-04 DIAGNOSIS — O021 Missed abortion: Secondary | ICD-10-CM

## 2021-08-04 LAB — CERVICOVAGINAL ANCILLARY ONLY
Bacterial Vaginitis (gardnerella): POSITIVE — AB
Candida Glabrata: NEGATIVE
Candida Vaginitis: NEGATIVE
Chlamydia: NEGATIVE
Comment: NEGATIVE
Comment: NEGATIVE
Comment: NEGATIVE
Comment: NEGATIVE
Comment: NEGATIVE
Comment: NORMAL
Neisseria Gonorrhea: NEGATIVE
Trichomonas: NEGATIVE

## 2021-08-04 LAB — BETA HCG QUANT (REF LAB): hCG Quant: 48 m[IU]/mL

## 2021-08-04 MED ORDER — METRONIDAZOLE 500 MG PO TABS: 500.0000 mg | ORAL_TABLET | Freq: Two times a day (BID) | ORAL | 0 refills | Status: DC

## 2021-08-04 NOTE — Addendum Note (Signed)
Addended by: Virginia Rochester on: 08/04/2021 09:37 PM   Modules accepted: Orders

## 2021-08-05 ENCOUNTER — Telehealth: Payer: Self-pay

## 2021-08-05 NOTE — BH Specialist Note (Signed)
Integrated Behavioral Health via Telemedicine Visit  08/05/2021 [name] UO:[id number]  Number of [place] visits: 1 Session Start time: 10:20 Session End time: 10:50 Total time: 30  Referring Provider: [name], NP Patient/Family location: [facility] Provider location: [facility] [place] [facility]  All persons participating in visit: [name] and [place]   Types of Service: Individual psychotherapy and Video visit  I connected with [name] and/or [name] D Packman's  n/a  via  Telephone or Video Enabled Telemedicine Application  (Video is Caregility application) and verified that I am speaking with the correct person using two identifiers. Discussed confidentiality: Yes   I discussed the limitations of telemedicine and the availability of in person appointments.  Discussed there is a possibility of technology failure and discussed alternative modes of communication if that failure occurs.  I discussed that engaging in this telemedicine visit, they consent to the provision of behavioral healthcare and the services will be billed under their insurance.  Patient and/or legal guardian expressed understanding and consented to Telemedicine visit: Yes   Presenting Concerns: Patient and/or family reports the following symptoms/concerns: Grieving loss of pregnancy of daughter; sleep issues have improved with self-coping strategies; feels well-supported by loved ones. Currently feeling some stress regarding finances (unable to work with severe hyperemesis); hopeful about the future.  Patient and/or Family's Strengths/Protective Factors: Social connections, Social and Patent attorney, Concrete supports in place (healthy food, safe environments, etc.), Sense of purpose, and Physical Health (exercise, healthy diet, medication compliance, etc.)  Goals Addressed: Patient will:  Reduce  symptoms of: stress   Increase knowledge and/or ability of: healthy habits and stress reduction   Demonstrate ability to: Increase healthy adjustment to current life circumstances  Progress towards Goals: Ongoing  Interventions: Interventions utilized:  Psychoeducation and/or Health Education, Link to [employer], and Supportive Reflection Standardized Assessments completed: GAD-7 and PHQ 9  Patient and/or Family Response: Pt agrees with treatment plan; will call to schedule f/u as needed  Assessment: Patient currently experiencing Grief and Psychosocial stress.   Patient may benefit from psychoeducation and brief therapeutic interventions regarding coping with symptoms of grief and life stress .  Plan: Follow up with behavioral health clinician on : Call [name] at [phone], as needed Behavioral recommendations:  -Continue allowing feelings of grief to come and go -Continue prioritizing healthy self-care and utilizing self-coping strategies to manage both grief and life stress -Consider additional community resources on After Visit Summary  I discussed the assessment and treatment plan with the patient and/or parent/guardian. They were provided an opportunity to ask questions and all were answered. They agreed with the plan and demonstrated an understanding of the instructions.   They were advised to call back or seek an in-person evaluation if the symptoms worsen or if the condition fails to improve as anticipated.  Garlan Fair, LCSW  Depression screen [facility] 2/9 08/11/2021 07/05/2021 06/28/2021  Decreased Interest 0 3 3  Down, Depressed, Hopeless 1 3 3   PHQ - 2 Score 1 6 6   Altered sleeping 0 1 1  Tired, decreased energy 0 3 3  Change in appetite 0 3 3  Feeling bad or failure about yourself  0 0 0  Trouble concentrating 0 1 0  Moving slowly or fidgety/restless 0 3 0  Suicidal thoughts 0 0 0  PHQ-9 Score 1 17 13    GAD 7 : Generalized Anxiety Score 08/11/2021  07/05/2021 06/28/2021  Nervous, Anxious,  on Edge 0 0 0  Control/stop worrying 1 1 3   Worry too much - different things 1 2 3   Trouble relaxing 0 1 3  Restless 0 0 0  Easily annoyed or irritable 0 2 1  Afraid - awful might happen 0 0 0  Total GAD 7 Score 2 6 10

## 2021-08-05 NOTE — Telephone Encounter (Addendum)
-----   Message from [name], NP sent at 08/04/2021  9:37 PM EST ----- Please cancel [place] appointment in Feb.  Has had a miscarriage.   And she probably need another lab appointment around Feb 1.  I would go ahead and schedule that.  Thank you.  Pt U/S cancelled on 08/29/21. Pt already scheduled for non stat beta on 08/10/21.    Leonette Nutting  08/05/21

## 2021-08-10 ENCOUNTER — Other Ambulatory Visit: Payer: Medicaid Other

## 2021-08-10 ENCOUNTER — Other Ambulatory Visit: Payer: Self-pay

## 2021-08-10 DIAGNOSIS — O039 Complete or unspecified spontaneous abortion without complication: Secondary | ICD-10-CM

## 2021-08-10 DIAGNOSIS — O019 Hydatidiform mole, unspecified: Secondary | ICD-10-CM

## 2021-08-11 ENCOUNTER — Ambulatory Visit (INDEPENDENT_AMBULATORY_CARE_PROVIDER_SITE_OTHER): Payer: Medicaid Other | Admitting: Clinical

## 2021-08-11 DIAGNOSIS — F4321 Adjustment disorder with depressed mood: Secondary | ICD-10-CM

## 2021-08-11 LAB — BETA HCG QUANT (REF LAB): hCG Quant: 24 m[IU]/mL

## 2021-08-11 NOTE — Patient Instructions (Signed)
[facility] [address] [place], [place] [address] [phone] (main office) [phone] (Edilia Ghuman's office)  [facility] www.womenscentergso.org   LIEAP (Low Income Energy Assistance Program) https://www.ncdhhs.gov/divisions/social-services/energy-assistance/low-income-energy-assistance-lieap  LIHWAP (Low Income Household Water Assistance Program) https://www.ncdhhs.gov/divisions/social-services/energy-assistance/low-income-household-water-assistance-program-lihwap  

## 2021-08-11 NOTE — Addendum Note (Signed)
Addended by: [name] on: 08/11/2021 12:32 PM   Modules accepted: Orders

## 2021-08-12 ENCOUNTER — Telehealth: Payer: Self-pay

## 2021-08-12 DIAGNOSIS — O021 Missed abortion: Secondary | ICD-10-CM

## 2021-08-12 NOTE — Telephone Encounter (Signed)
Called pt and let her know that the provider would like for her to come in one week beta.  Pt states that she will be able to come in on 08/18/21 @ 0930 for non stat beta.    Patricia Craig  08/12/21

## 2021-08-18 ENCOUNTER — Other Ambulatory Visit: Payer: Medicaid Other

## 2021-08-22 ENCOUNTER — Other Ambulatory Visit: Payer: Medicaid Other

## 2021-08-22 ENCOUNTER — Other Ambulatory Visit: Payer: Self-pay

## 2021-08-22 DIAGNOSIS — O021 Missed abortion: Secondary | ICD-10-CM

## 2021-08-23 ENCOUNTER — Telehealth: Payer: Self-pay | Admitting: Family Medicine

## 2021-08-23 ENCOUNTER — Other Ambulatory Visit: Payer: Self-pay | Admitting: Nurse Practitioner

## 2021-08-23 DIAGNOSIS — O019 Hydatidiform mole, unspecified: Secondary | ICD-10-CM

## 2021-08-23 LAB — BETA HCG QUANT (REF LAB): hCG Quant: 9 m[IU]/mL

## 2021-08-23 NOTE — Telephone Encounter (Signed)
Called patient to schedule appointment, there was no answer to the phone call so a voicemail with the call back number for the office was left.

## 2021-08-26 ENCOUNTER — Telehealth: Payer: Self-pay | Admitting: Family Medicine

## 2021-08-26 NOTE — Telephone Encounter (Signed)
Patient has an appointment on 08/30/21 but states that she will not be able to make it due to her new job. She would like to speak with a nurse to see how much of importance is this appointment and will she be ok if she misses it.

## 2021-08-29 ENCOUNTER — Ambulatory Visit: Payer: Medicaid Other

## 2021-08-29 NOTE — Telephone Encounter (Signed)
Called pt; VM left stating patient may call the office to reschedule appt. MyChart message sent.

## 2021-08-30 ENCOUNTER — Other Ambulatory Visit: Payer: Medicaid Other

## 2021-09-06 ENCOUNTER — Telehealth: Payer: Self-pay

## 2021-09-06 NOTE — Telephone Encounter (Addendum)
-----   Message from [name], NP sent at 09/06/2021  4:55 PM EST ----- Regarding: Needs a letter She has missed a lab draw and she needs monthly lab draws x 3 months after she reaches <5 or zero.  Her last quant was not quite yet to zero.  But she at a minimum needs 3 more monthly labs.  So a letter is needed to make sure she is not lost to care.  She is not responding to phone calls.  Thank you.   Called pt; VM left stating I am calling to follow up for [name], NP who recommends patient be scheduled for appt. Advised patient I will send letter and MyChart message; both sent.

## 2021-09-12 ENCOUNTER — Other Ambulatory Visit: Payer: Self-pay | Admitting: Nurse Practitioner

## 2021-09-12 NOTE — Progress Notes (Signed)
Patricia Craig 26 y.o. CC: possible molar pregnancy determined by pathology from [facility] for missed AB.  Patient did not come for her last weekly BHCG to confirm level of less than 5 considered zero. Her last level on 08-22-21 was 9. She still needs monthly lab draws X 3 months. MyChart message sent and letter sent.   She may be lost to follow up if she does not respond at all.  Nolene Bernheim, RN, MSN, NP-BC Nurse Practitioner, [facility] for [employer], [facility] 09/12/2021 10:44 AM

## 2021-10-03 ENCOUNTER — Other Ambulatory Visit: Payer: Self-pay

## 2021-10-03 ENCOUNTER — Other Ambulatory Visit: Payer: Medicaid Other

## 2021-10-03 DIAGNOSIS — O019 Hydatidiform mole, unspecified: Secondary | ICD-10-CM

## 2021-10-04 LAB — BETA HCG QUANT (REF LAB): hCG Quant: 1 m[IU]/mL

## 2021-10-06 ENCOUNTER — Telehealth: Payer: Self-pay | Admitting: *Deleted

## 2021-10-06 DIAGNOSIS — O019 Hydatidiform mole, unspecified: Secondary | ICD-10-CM

## 2021-10-06 DIAGNOSIS — O021 Missed abortion: Secondary | ICD-10-CM

## 2021-10-06 NOTE — Telephone Encounter (Addendum)
-----   Message from Hermina Staggers, MD sent at 10/05/2021  6:19 PM EDT ----- ?Please repeat BHCG in 1 week, non stat. ? ?Thanks ?Casimiro Needle  ? ?3/23  1700  Called pt to discuss test results and change in plan of care. Pt did not answer. I left message stating that I observed she has seen the message in Mychart regarding need for repeat hormone level. I will schedule the lab appointment and she may check it in Mychart. She may call back if she has questions regarding this change in care.  Per chart review, pt has seen Dr. Waynard Edwards message in Mychart that she will need hormone level drawn weekly times 3. If results remain less than 5 for 3 times then she will need monthly checks of hormone level for 6 months.  ?

## 2021-10-07 ENCOUNTER — Telehealth: Payer: Self-pay | Admitting: Family Medicine

## 2021-10-07 NOTE — Telephone Encounter (Signed)
Called pt; VM left stating she may send MyChart message prior to 12 PM today or call back after 8 AM on Monday.  ?

## 2021-10-07 NOTE — Telephone Encounter (Signed)
Patient would like a call she have questions about her upcoming lab appointments.  ?

## 2021-10-10 ENCOUNTER — Other Ambulatory Visit: Payer: Medicaid Other

## 2021-10-10 NOTE — Telephone Encounter (Signed)
Returned patient's phone call regarding future appointments. Patient states she doesn't understand why she needs quants weekly again when they had been monthly. Discussed with patient they were supposed to be weekly this whole time but she had missed appts or wasn't able to come in with her job. Explained to patient she needs weekly quant this week & next week. If both results are less than 5 then she will need quants monthly through November. Patient verbalized understanding to all. ?

## 2021-10-12 ENCOUNTER — Other Ambulatory Visit: Payer: Medicaid Other

## 2021-10-17 ENCOUNTER — Other Ambulatory Visit: Payer: Medicaid Other

## 2021-10-17 DIAGNOSIS — O019 Hydatidiform mole, unspecified: Secondary | ICD-10-CM

## 2021-10-17 DIAGNOSIS — O021 Missed abortion: Secondary | ICD-10-CM

## 2021-10-18 LAB — BETA HCG QUANT (REF LAB): hCG Quant: <1 m[IU]/mL

## 2021-10-20 ENCOUNTER — Telehealth: Payer: Self-pay | Admitting: *Deleted

## 2021-10-20 NOTE — Telephone Encounter (Addendum)
-----   Message from Hermina Staggers, MD sent at 10/20/2021  3:18 PM EDT ----- ?Please repeat BHCG in 1 week, non stat. ?Thanks ?Casimiro Needle  ? ?4/6  1630  Called pt and informed her that hormone level is remaining low as desired. She will need another level done in one week. If that result is still less than 5, she will go to monthly checks for 6 months. Pt voiced understanding and agreed to lab appt on 4/12 @ 10:00am.  ?

## 2021-10-26 ENCOUNTER — Other Ambulatory Visit: Payer: Medicaid Other

## 2021-10-26 DIAGNOSIS — O021 Missed abortion: Secondary | ICD-10-CM

## 2021-10-26 DIAGNOSIS — O019 Hydatidiform mole, unspecified: Secondary | ICD-10-CM

## 2021-10-27 LAB — BETA HCG QUANT (REF LAB): hCG Quant: <1 m[IU]/mL

## 2021-10-31 ENCOUNTER — Other Ambulatory Visit: Payer: Self-pay

## 2021-11-02 ENCOUNTER — Encounter: Payer: Self-pay | Admitting: *Deleted

## 2021-11-02 ENCOUNTER — Telehealth: Payer: Self-pay | Admitting: *Deleted

## 2021-11-02 DIAGNOSIS — O019 Hydatidiform mole, unspecified: Secondary | ICD-10-CM

## 2021-11-02 NOTE — Telephone Encounter (Addendum)
-----   Message from Hermina Staggers, MD sent at 10/31/2021  9:28 AM EDT ----- ?Please schedule pt for monthly non stat BHCG x 6. ?Pt is aware. ?Thanks ?Casimiro Needle  ? ?4/19  1415  Called pt regarding test result and plan of care and she did not answer. Voicemail message left stating that I will send her a Mychart message. Per chart review, pt has read the note from Dr. Alysia Penna stating that she will now require monthly BHCG level drawn. All lab appts scheduled.  ?

## 2021-11-28 ENCOUNTER — Other Ambulatory Visit: Payer: Medicaid Other

## 2021-11-28 ENCOUNTER — Other Ambulatory Visit: Payer: Self-pay

## 2021-11-28 DIAGNOSIS — O019 Hydatidiform mole, unspecified: Secondary | ICD-10-CM

## 2021-11-29 LAB — BETA HCG QUANT (REF LAB): hCG Quant: <1 m[IU]/mL

## 2021-12-26 ENCOUNTER — Other Ambulatory Visit: Payer: Medicaid Other

## 2022-01-23 ENCOUNTER — Other Ambulatory Visit: Payer: Medicaid Other

## 2022-02-23 ENCOUNTER — Other Ambulatory Visit: Payer: Self-pay

## 2022-02-23 DIAGNOSIS — O019 Hydatidiform mole, unspecified: Secondary | ICD-10-CM

## 2022-02-23 NOTE — Progress Notes (Signed)
Encounter opened in error

## 2022-02-27 ENCOUNTER — Other Ambulatory Visit: Payer: Medicaid Other

## 2022-02-27 ENCOUNTER — Telehealth: Payer: Self-pay | Admitting: Family Medicine

## 2022-02-27 NOTE — Telephone Encounter (Signed)
Called pt to reschedule lab work, there was no answer to the phone call and the option to leave a voicemail was unavailable.

## 2022-02-28 ENCOUNTER — Other Ambulatory Visit: Payer: Medicaid Other

## 2022-02-28 ENCOUNTER — Other Ambulatory Visit: Payer: Self-pay

## 2022-02-28 DIAGNOSIS — O021 Missed abortion: Secondary | ICD-10-CM

## 2022-03-17 ENCOUNTER — Emergency Department (HOSPITAL_BASED_OUTPATIENT_CLINIC_OR_DEPARTMENT_OTHER)
Admission: EM | Admit: 2022-03-17 | Discharge: 2022-03-17 | Disposition: A | Discharge status: Home / Self Care | Payer: BC Managed Care – PPO | Attending: Emergency Medicine | Admitting: Emergency Medicine

## 2022-03-17 ENCOUNTER — Encounter (HOSPITAL_BASED_OUTPATIENT_CLINIC_OR_DEPARTMENT_OTHER): Payer: Self-pay | Admitting: Emergency Medicine

## 2022-03-17 ENCOUNTER — Other Ambulatory Visit: Payer: Self-pay

## 2022-03-17 DIAGNOSIS — R1013 Epigastric pain: Secondary | ICD-10-CM | POA: Diagnosis not present

## 2022-03-17 DIAGNOSIS — E876 Hypokalemia: Secondary | ICD-10-CM | POA: Diagnosis not present

## 2022-03-17 DIAGNOSIS — R109 Unspecified abdominal pain: Secondary | ICD-10-CM

## 2022-03-17 LAB — CBC WITH DIFFERENTIAL/PLATELET
Abs Immature Granulocytes: 0.01 10*3/uL (ref 0.00–0.07)
Basophils Absolute: 0.1 10*3/uL (ref 0.0–0.1)
Basophils Relative: 1 %
Eosinophils Absolute: 0.1 10*3/uL (ref 0.0–0.5)
Eosinophils Relative: 2 %
HCT: 29.7 % — ABNORMAL LOW (ref 36.0–46.0)
Hemoglobin: 8.9 g/dL — ABNORMAL LOW (ref 12.0–15.0)
Immature Granulocytes: 0 %
Lymphocytes Relative: 41 %
Lymphs Abs: 1.9 10*3/uL (ref 0.7–4.0)
MCH: 21.2 pg — ABNORMAL LOW (ref 26.0–34.0)
MCHC: 30 g/dL (ref 30.0–36.0)
MCV: 70.9 fL — ABNORMAL LOW (ref 80.0–100.0)
Monocytes Absolute: 0.5 10*3/uL (ref 0.1–1.0)
Monocytes Relative: 11 %
Neutro Abs: 2.1 10*3/uL (ref 1.7–7.7)
Neutrophils Relative %: 45 %
Platelets: 198 10*3/uL (ref 150–400)
RBC: 4.19 MIL/uL (ref 3.87–5.11)
RDW: 18.2 % — ABNORMAL HIGH (ref 11.5–15.5)
WBC: 4.7 10*3/uL (ref 4.0–10.5)
nRBC: 0 % (ref 0.0–0.2)

## 2022-03-17 LAB — COMPREHENSIVE METABOLIC PANEL
ALT: 8 U/L (ref 0–44)
AST: 15 U/L (ref 15–41)
Albumin: 4 g/dL (ref 3.5–5.0)
Alkaline Phosphatase: 41 U/L (ref 38–126)
Anion gap: 5 (ref 5–15)
BUN: 11 mg/dL (ref 6–20)
CO2: 27 mmol/L (ref 22–32)
Calcium: 8.6 mg/dL — ABNORMAL LOW (ref 8.9–10.3)
Chloride: 106 mmol/L (ref 98–111)
Creatinine, Ser: 0.78 mg/dL (ref 0.44–1.00)
GFR, Estimated: >60 mL/min (ref >60)
Glucose, Bld: 79 mg/dL (ref 70–99)
Potassium: 3.2 mmol/L — ABNORMAL LOW (ref 3.5–5.1)
Sodium: 138 mmol/L (ref 135–145)
Total Bilirubin: 0.7 mg/dL (ref 0.3–1.2)
Total Protein: 7.3 g/dL (ref 6.5–8.1)

## 2022-03-17 LAB — URINALYSIS, ROUTINE W REFLEX MICROSCOPIC
Bilirubin Urine: NEGATIVE
Glucose, UA: NEGATIVE mg/dL
Hgb urine dipstick: NEGATIVE
Ketones, ur: NEGATIVE mg/dL
Nitrite: NEGATIVE
Protein, ur: NEGATIVE mg/dL
Specific Gravity, Urine: >=1.03 (ref 1.005–1.030)
pH: 5 (ref 5.0–8.0)

## 2022-03-17 LAB — LIPASE, BLOOD: Lipase: 32 U/L (ref 11–51)

## 2022-03-17 LAB — URINALYSIS, MICROSCOPIC (REFLEX): RBC / HPF: NONE SEEN RBC/hpf (ref 0–5)

## 2022-03-17 LAB — PREGNANCY, URINE: Preg Test, Ur: NEGATIVE

## 2022-03-17 MED ORDER — POTASSIUM CHLORIDE CRYS ER 20 MEQ PO TBCR
40.0000 meq | EXTENDED_RELEASE_TABLET | Freq: Once | ORAL | Status: AC
  Administered 2022-03-17: 40 meq via ORAL
  Filled 2022-03-17: qty 2

## 2022-03-17 NOTE — ED Triage Notes (Signed)
Patient c/o tightening in her upper abdomen for the last 3 days. Patient denies any vomiting or diarrhea, urinary symptoms, no vaginal discharge.

## 2022-03-17 NOTE — Discharge Instructions (Addendum)
You were seen today for abdominal tightness or fullness.  Your work-up revealed no cause for your symptoms.  As discussed your potassium was low and your hemoglobin was low.  These are consistent with previous values but they need further evaluation by primary care doctor.  I highly recommend establishing care with a primary care physician.  Please return to the emergency department develop any life-threatening symptoms

## 2022-03-17 NOTE — ED Provider Notes (Signed)
[facility] EMERGENCY DEPARTMENT Provider Note   CSN: [id number] Arrival date & time: 03/17/22  1911     History  Chief Complaint  Patient presents with   Abdominal Pain    Patricia Craig is a 26 y.o. female.  Patient presents to the hospital complaining of an epigastric fullness or tightness.  She states that she noticed this area approximately 3 days ago.  She denies any pain or burning in the abdominal region.  Denies shortness of breath and chest pain, nausea, vomiting, diarrhea, constipation, urinary symptoms, vaginal bleeding or abnormal vaginal discharge.  She is concerned about the tight area.  Past medical history significant for herpes simplex type II infection, anxiety, insomnia, protein calorie malnutrition, severe  HPI     Home Medications Prior to Admission medications   Medication Sig Start Date End Date Taking? Authorizing Provider  HYDROcodone-acetaminophen (NORCO/VICODIN) 5-325 MG tablet Take 1 tablet by mouth every 6 (six) hours as needed. Patient not taking: Reported on 08/03/2021 07/12/21   Lazaro Arms, MD  metroNIDAZOLE (FLAGYL) 500 MG tablet Take 1 tablet (500 mg total) by mouth 2 (two) times daily. No alcohol while taking this medication 08/04/21   [name], NP  misoprostol (CYTOTEC) 200 MCG tablet Take 2 tablets (400 mcg total) by mouth 3 (three) times daily. Patient not taking: Reported on 08/03/2021 07/12/21   Lazaro Arms, MD  ondansetron (ZOFRAN-ODT) 8 MG disintegrating tablet Take 1 tablet (8 mg total) by mouth every 8 (eight) hours as needed for nausea or vomiting. Patient not taking: Reported on 08/03/2021 06/30/21   Cristopher Peru, PA-C  promethazine (PHENERGAN) 25 MG tablet Take 1 tablet (25 mg total) by mouth every 6 (six) hours as needed for nausea or vomiting. Patient not taking: Reported on 08/03/2021 07/05/21   [name], NP  scopolamine (TRANSDERM-SCOP) 1 MG/3DAYS Place 1 patch (1.5 mg total) onto the skin every 3  (three) days. Patient not taking: Reported on 08/03/2021 06/30/21   Cristopher Peru, PA-C      Allergies    Patient has no known allergies.    Review of Systems   Review of Systems  Respiratory:  Negative for shortness of breath.   Gastrointestinal:  Negative for abdominal pain, constipation, diarrhea, nausea and vomiting.       Abdominal "tightness"  Genitourinary:  Negative for dysuria, vaginal bleeding and vaginal discharge.    Physical Exam Updated Vital Signs BP 106/89   Pulse 65   Temp 98.4 F (36.9 C) (Oral)   Resp 20   Ht 5\' 2"  (1.575 m)   Wt 59.9 kg   SpO2 100%   BMI 24.14 kg/m  Physical Exam Vitals and nursing note reviewed.  Constitutional:      General: She is not in acute distress.    Appearance: She is normal weight.  HENT:     Head: Normocephalic and atraumatic.     Mouth/Throat:     Mouth: Mucous membranes are moist.  Eyes:     Extraocular Movements: Extraocular movements intact.  Cardiovascular:     Rate and Rhythm: Normal rate and regular rhythm.     Heart sounds: Normal heart sounds.  Pulmonary:     Effort: Pulmonary effort is normal.     Breath sounds: Normal breath sounds.  Abdominal:     General: Abdomen is flat. Bowel sounds are normal. There is no distension.     Palpations: Abdomen is soft.     Tenderness: There is  no abdominal tenderness.     Comments: No distention noted.  No tenderness to palpation in any quadrant  Skin:    General: Skin is warm and dry.     Capillary Refill: Capillary refill takes less than 2 seconds.  Neurological:     Mental Status: She is alert and oriented to person, place, and time.     ED Results / Procedures / Treatments   Labs (all labs ordered are listed, but only abnormal results are displayed) Labs Reviewed  URINALYSIS, ROUTINE W REFLEX MICROSCOPIC - Abnormal; Notable for the following components:      Result Value   Leukocytes,Ua TRACE (*)    All other components within normal limits  CBC WITH  DIFFERENTIAL/PLATELET - Abnormal; Notable for the following components:   Hemoglobin 8.9 (*)    HCT 29.7 (*)    MCV 70.9 (*)    MCH 21.2 (*)    RDW 18.2 (*)    All other components within normal limits  COMPREHENSIVE METABOLIC PANEL - Abnormal; Notable for the following components:   Potassium 3.2 (*)    Calcium 8.6 (*)    All other components within normal limits  URINALYSIS, MICROSCOPIC (REFLEX) - Abnormal; Notable for the following components:   Bacteria, UA RARE (*)    All other components within normal limits  PREGNANCY, URINE  LIPASE, BLOOD    EKG EKG Interpretation  Date/Time:  Friday March 17 2022 19:36:50 EDT Ventricular Rate:  63 PR Interval:  157 QRS Duration: 74 QT Interval:  380 QTC Calculation: 389 R Axis:   71 Text Interpretation: Sinus rhythm RSR' in V1 or V2, probably normal variant Baseline wander in lead(s) V1 no acute ST/T changes similar to 2014 Confirmed by Pricilla Loveless [phone]) on 03/17/2022 7:54:36 PM  Radiology No results found.  Procedures Procedures    Medications Ordered in ED Medications  potassium chloride SA (KLOR-CON M) CR tablet 40 mEq (has no administration in time range)    ED Course/ Medical Decision Making/ A&P                           Medical Decision Making Amount and/or Complexity of Data Reviewed Labs: ordered.   This patient presents to the ED for concern of abdominal fullness, this involves an extensive number of treatment options, and is a complaint that carries with it a high risk of complications and morbidity.  The differential diagnosis includes pancreatitis, gastritis, cholecystitis, and others   Co morbidities that complicate the patient evaluation  None   Additional history obtained:  External records from outside source obtained and reviewed including recent notes showing a possible missed abortion in August of this year   Lab Tests:  I Ordered, and personally interpreted labs.  The pertinent  results include: Hemoglobin 8.9 consistent with hemoglobin from 8 months ago, potassium 3.2, lipase 32, grossly normal urinalysis, negative pregnancy test   The patient was hypokalemic and I ordered potassium p.o.  Imaging Studies ordered:  I considered imaging but with the patient having no tenderness to palpation so little utility at this time  Cardiac Monitoring: / EKG:  The patient was maintained on a cardiac monitor.  I personally viewed and interpreted the cardiac monitored which showed an underlying rhythm of: Sinus rhythm    Test / Admission - Considered:  The patient is mildly hypokalemic and is anemic but the anemia appears close to the patient's most recent baseline.  Recommend that she  follow-up with primary care to discuss potassium supplementation and anemia work-up.  These lab values would not explain the patient's symptoms in any way.  She has no signs of pancreatitis with normal lipase.  Her urinalysis is normal showing no signs of UTI.  Negative pregnancy test.  No point tenderness to suggest appendicitis or cholecystitis.  No nausea, vomiting to suggest a gastroenteritis.  Unclear etiology.        Final Clinical Impression(s) / ED Diagnoses Final diagnoses:  Abdominal pain, unspecified abdominal location  Hypokalemia    Rx / DC Orders ED Discharge Orders     None         Pamala Duffel 03/17/22 2042    Pricilla Loveless, MD 03/17/22 2133

## 2022-03-24 ENCOUNTER — Other Ambulatory Visit: Payer: Self-pay

## 2022-03-24 DIAGNOSIS — O039 Complete or unspecified spontaneous abortion without complication: Secondary | ICD-10-CM

## 2022-03-27 ENCOUNTER — Other Ambulatory Visit: Payer: Medicaid Other

## 2022-04-24 ENCOUNTER — Other Ambulatory Visit: Payer: Medicaid Other

## 2022-10-31 ENCOUNTER — Emergency Department (HOSPITAL_BASED_OUTPATIENT_CLINIC_OR_DEPARTMENT_OTHER)
Admission: EM | Admit: 2022-10-31 | Discharge: 2022-10-31 | Disposition: A | Discharge status: Home / Self Care | Payer: Medicaid Other | Attending: Emergency Medicine | Admitting: Emergency Medicine

## 2022-10-31 ENCOUNTER — Other Ambulatory Visit: Payer: Self-pay

## 2022-10-31 ENCOUNTER — Emergency Department (HOSPITAL_BASED_OUTPATIENT_CLINIC_OR_DEPARTMENT_OTHER): Payer: Medicaid Other

## 2022-10-31 ENCOUNTER — Encounter (HOSPITAL_BASED_OUTPATIENT_CLINIC_OR_DEPARTMENT_OTHER): Payer: Self-pay | Admitting: Emergency Medicine

## 2022-10-31 DIAGNOSIS — D649 Anemia, unspecified: Secondary | ICD-10-CM | POA: Diagnosis not present

## 2022-10-31 DIAGNOSIS — O99011 Anemia complicating pregnancy, first trimester: Secondary | ICD-10-CM | POA: Diagnosis not present

## 2022-10-31 DIAGNOSIS — R1013 Epigastric pain: Secondary | ICD-10-CM | POA: Diagnosis not present

## 2022-10-31 DIAGNOSIS — Z3201 Encounter for pregnancy test, result positive: Secondary | ICD-10-CM | POA: Insufficient documentation

## 2022-10-31 DIAGNOSIS — O26891 Other specified pregnancy related conditions, first trimester: Secondary | ICD-10-CM | POA: Diagnosis present

## 2022-10-31 DIAGNOSIS — N83292 Other ovarian cyst, left side: Secondary | ICD-10-CM | POA: Insufficient documentation

## 2022-10-31 DIAGNOSIS — Z3A00 Weeks of gestation of pregnancy not specified: Secondary | ICD-10-CM | POA: Insufficient documentation

## 2022-10-31 DIAGNOSIS — O3481 Maternal care for other abnormalities of pelvic organs, first trimester: Secondary | ICD-10-CM | POA: Insufficient documentation

## 2022-10-31 LAB — COMPREHENSIVE METABOLIC PANEL
ALT: 8 U/L (ref 0–44)
AST: 16 U/L (ref 15–41)
Albumin: 4 g/dL (ref 3.5–5.0)
Alkaline Phosphatase: 36 U/L — ABNORMAL LOW (ref 38–126)
Anion gap: 5 (ref 5–15)
BUN: 10 mg/dL (ref 6–20)
CO2: 25 mmol/L (ref 22–32)
Calcium: 8.5 mg/dL — ABNORMAL LOW (ref 8.9–10.3)
Chloride: 106 mmol/L (ref 98–111)
Creatinine, Ser: 0.78 mg/dL (ref 0.44–1.00)
GFR, Estimated: >60 mL/min (ref >60)
Glucose, Bld: 87 mg/dL (ref 70–99)
Potassium: 3.9 mmol/L (ref 3.5–5.1)
Sodium: 136 mmol/L (ref 135–145)
Total Bilirubin: 1.7 mg/dL — ABNORMAL HIGH (ref 0.3–1.2)
Total Protein: 7.2 g/dL (ref 6.5–8.1)

## 2022-10-31 LAB — LIPASE, BLOOD: Lipase: 30 U/L (ref 11–51)

## 2022-10-31 LAB — CBC WITH DIFFERENTIAL/PLATELET
Abs Immature Granulocytes: 0.01 10*3/uL (ref 0.00–0.07)
Basophils Absolute: 0 10*3/uL (ref 0.0–0.1)
Basophils Relative: 1 %
Eosinophils Absolute: 0.1 10*3/uL (ref 0.0–0.5)
Eosinophils Relative: 1 %
HCT: 32.8 % — ABNORMAL LOW (ref 36.0–46.0)
Hemoglobin: 10 g/dL — ABNORMAL LOW (ref 12.0–15.0)
Immature Granulocytes: 0 %
Lymphocytes Relative: 26 %
Lymphs Abs: 1.2 10*3/uL (ref 0.7–4.0)
MCH: 23.3 pg — ABNORMAL LOW (ref 26.0–34.0)
MCHC: 30.5 g/dL (ref 30.0–36.0)
MCV: 76.3 fL — ABNORMAL LOW (ref 80.0–100.0)
Monocytes Absolute: 0.4 10*3/uL (ref 0.1–1.0)
Monocytes Relative: 8 %
Neutro Abs: 2.9 10*3/uL (ref 1.7–7.7)
Neutrophils Relative %: 64 %
Platelets: 198 10*3/uL (ref 150–400)
RBC: 4.3 MIL/uL (ref 3.87–5.11)
RDW: 18.8 % — ABNORMAL HIGH (ref 11.5–15.5)
WBC: 4.5 10*3/uL (ref 4.0–10.5)
nRBC: 0 % (ref 0.0–0.2)

## 2022-10-31 LAB — URINALYSIS, ROUTINE W REFLEX MICROSCOPIC
Bilirubin Urine: NEGATIVE
Glucose, UA: NEGATIVE mg/dL
Hgb urine dipstick: NEGATIVE
Ketones, ur: NEGATIVE mg/dL
Nitrite: NEGATIVE
Protein, ur: NEGATIVE mg/dL
Specific Gravity, Urine: 1.02 (ref 1.005–1.030)
pH: 7 (ref 5.0–8.0)

## 2022-10-31 LAB — URINALYSIS, MICROSCOPIC (REFLEX)

## 2022-10-31 LAB — HCG, SERUM, QUALITATIVE: Preg, Serum: POSITIVE — AB

## 2022-10-31 LAB — HCG, QUANTITATIVE, PREGNANCY: hCG, Beta Chain, Quant, S: 11 m[IU]/mL — ABNORMAL HIGH (ref <5)

## 2022-10-31 NOTE — ED Provider Notes (Signed)
[place] EMERGENCY DEPARTMENT AT [facility] Provider Note   CSN: [id number] Arrival date & time: 10/31/22  4098     History  Chief Complaint  Patient presents with   Abdominal Pain    Patricia Craig is a 27 y.o. female past medical history of anxiety, herpes presents today for evaluation of abdominal pain.  Patient reports she has had abdominal pain in the last 4 days.  Pain is located in the epigastrium, constant, worse with laying down and with certain position, nonradiating.  Patient also reports intermittent cramping in her lower abdomen.  LMP was 2 weeks ago.  Denies any chest pain, shortness of breath, fever, nausea, vomiting, bowel changes, urinary symptoms, abnormal vaginal discharge, rash.  States she is concerned if she is pregnant again.  States she had a miscarriage last year with similar symptoms.   Abdominal Pain     Past Medical History:  Diagnosis Date   Anxiety    Herpes simplex type 2 infection    Past Surgical History:  Procedure Laterality Date   DILATION AND EVACUATION N/A 07/12/2021   Procedure: DILATATION AND EVACUATION;  Surgeon: Lazaro Arms, MD;  Location: [facility] OR;  Service: Gynecology;  Laterality: N/A;   MOUTH SURGERY       Home Medications Prior to Admission medications   Medication Sig Start Date End Date Taking? Authorizing Provider  HYDROcodone-acetaminophen (NORCO/VICODIN) 5-325 MG tablet Take 1 tablet by mouth every 6 (six) hours as needed. Patient not taking: Reported on 08/03/2021 07/12/21   Lazaro Arms, MD  metroNIDAZOLE (FLAGYL) 500 MG tablet Take 1 tablet (500 mg total) by mouth 2 (two) times daily. No alcohol while taking this medication 08/04/21   [name], NP  misoprostol (CYTOTEC) 200 MCG tablet Take 2 tablets (400 mcg total) by mouth 3 (three) times daily. Patient not taking: Reported on 08/03/2021 07/12/21   Lazaro Arms, MD  ondansetron (ZOFRAN-ODT) 8 MG disintegrating tablet Take 1 tablet (8 mg  total) by mouth every 8 (eight) hours as needed for nausea or vomiting. Patient not taking: Reported on 08/03/2021 06/30/21   Cristopher Peru, PA-C  promethazine (PHENERGAN) 25 MG tablet Take 1 tablet (25 mg total) by mouth every 6 (six) hours as needed for nausea or vomiting. Patient not taking: Reported on 08/03/2021 07/05/21   [name], NP  scopolamine (TRANSDERM-SCOP) 1 MG/3DAYS Place 1 patch (1.5 mg total) onto the skin every 3 (three) days. Patient not taking: Reported on 08/03/2021 06/30/21   Cristopher Peru, PA-C      Allergies    Patient has no known allergies.    Review of Systems   Review of Systems  Gastrointestinal:  Positive for abdominal pain.    Physical Exam Updated Vital Signs BP 110/70 (BP Location: Right Arm)   Pulse 75   Temp 98.8 F (37.1 C) (Oral)   Resp 20   Ht 5\' 2"  (1.575 m)   Wt 57.6 kg   SpO2 100%   BMI 23.23 kg/m  Physical Exam Vitals and nursing note reviewed.  Constitutional:      Appearance: Normal appearance.  HENT:     Head: Normocephalic and atraumatic.     Mouth/Throat:     Mouth: Mucous membranes are moist.  Eyes:     General: No scleral icterus. Cardiovascular:     Rate and Rhythm: Normal rate and regular rhythm.     Pulses: Normal pulses.     Heart sounds: Normal heart sounds.  Pulmonary:     Effort: Pulmonary effort is normal.     Breath sounds: Normal breath sounds.  Abdominal:     General: Abdomen is flat.     Palpations: Abdomen is soft.     Tenderness: There is abdominal tenderness in the epigastric area.  Musculoskeletal:        General: No deformity.  Skin:    General: Skin is warm.     Findings: No rash.  Neurological:     General: No focal deficit present.     Mental Status: She is alert.  Psychiatric:        Mood and Affect: Mood normal.     ED Results / Procedures / Treatments   Labs (all labs ordered are listed, but only abnormal results are displayed) Labs Reviewed  CBC WITH DIFFERENTIAL/PLATELET  - Abnormal; Notable for the following components:      Result Value   Hemoglobin 10.0 (*)    HCT 32.8 (*)    MCV 76.3 (*)    MCH 23.3 (*)    RDW 18.8 (*)    All other components within normal limits  COMPREHENSIVE METABOLIC PANEL - Abnormal; Notable for the following components:   Calcium 8.5 (*)    Alkaline Phosphatase 36 (*)    Total Bilirubin 1.7 (*)    All other components within normal limits  URINALYSIS, ROUTINE W REFLEX MICROSCOPIC - Abnormal; Notable for the following components:   Leukocytes,Ua TRACE (*)    All other components within normal limits  HCG, SERUM, QUALITATIVE - Abnormal; Notable for the following components:   Preg, Serum POSITIVE (*)    All other components within normal limits  URINALYSIS, MICROSCOPIC (REFLEX) - Abnormal; Notable for the following components:   Bacteria, UA RARE (*)    All other components within normal limits  HCG, QUANTITATIVE, PREGNANCY - Abnormal; Notable for the following components:   hCG, Beta Chain, Quant, S 11 (*)    All other components within normal limits  LIPASE, BLOOD    EKG EKG Interpretation  Date/Time:  Tuesday October 31 2022 09:53:31 EDT Ventricular Rate:  61 PR Interval:  158 QRS Duration: 84 QT Interval:  396 QTC Calculation: 399 R Axis:   57 Text Interpretation: Sinus rhythm Borderline Q waves in lateral leads Confirmed by Alvino Blood ([id number]) on 10/31/2022 9:56:35 AM  Radiology US OB LESS THAN 14 WEEKS WITH OB TRANSVAGINAL  Result Date: 10/31/2022 CLINICAL DATA:  [id number] Abdominal pain in pregnancy [id number] EXAM: OBSTETRIC <14 WK [place] AND TRANSVAGINAL OB US TECHNIQUE: Both transabdominal and transvaginal ultrasound examinations were performed for complete evaluation of the gestation as well as the maternal uterus, adnexal regions, and pelvic cul-de-sac. Transvaginal technique was performed to assess early pregnancy. COMPARISON:  None Available. FINDINGS: Intrauterine gestational sac: None Maternal uterus/adnexae:  Anteverted uterus. No intrauterine gestational sac. Complex left ovarian cyst measuring 4.9 cm with homogeneous low level internal echoes. Normal appearance of the right ovary. No adnexal masses are identified. Small volume free fluid within the cul-de-sac. IMPRESSION: 1. No intrauterine pregnancy or findings suspicious for ectopic pregnancy. Findings are consistent with pregnancy of unknown location and may reflect early intrauterine pregnancy not yet visualized sonographically, occult ectopic pregnancy, or failed pregnancy. Recommend trending of beta HCG as well as follow-up ultrasound in 7-10 days based on clinical course. 2. Complex left ovarian cyst measuring 4.9 cm with sonographic features most suggestive of a endometrioma or possibly a hemorrhagic cyst. Attention on follow-up. Electronically Signed   By: Janyth Pupa  Plundo D.O.   On: 10/31/2022 13:47    Procedures Procedures    Medications Ordered in ED Medications - No data to display  ED Course/ Medical Decision Making/ A&P                             Medical Decision Making Amount and/or Complexity of Data Reviewed Labs: ordered. Radiology: ordered.   This patient presents to the ED for epigastric badominal pain, this involves an extensive number of treatment options, and is a complaint that carries with a high risk of complications and morbidity.  The differential diagnosis includes aortic dissection, ACS/MI, enteritis, cholecystitis, GERD, pancreatitis, pneumonia, PUD, pregnancy.  This is not an exhaustive list.  Lab tests: I ordered and personally interpreted labs.  The pertinent results include: WBC unremarkable. Hbg unremarkable. Platelets unremarkable. Electrolytes unremarkable. BUN, creatinine unremarkable.  hCG quantitative 11.  Imaging studies: I ordered imaging studies. I personally reviewed, interpreted imaging and agree with the radiologist's interpretations. The results include:US showed 1. No intrauterine pregnancy or  findings suspicious for ectopic pregnancy. Findings are consistent with pregnancy of unknown location and may reflect early intrauterine pregnancy not yet visualized sonographically, occult ectopic pregnancy, or failed pregnancy. Recommend trending of beta HCG as well as follow-up ultrasound in 7-10 days based on clinical course. 2. Complex left ovarian cyst measuring 4.9 cm with sonographic features most suggestive of a endometrioma or possibly a hemorrhagic cyst.   Problem list/ ED course/ Critical interventions/ Medical management: HPI: See above Vital signs within normal range and stable throughout visit. Laboratory/imaging studies significant for: See above. On physical examination, patient is afebrile and appears in no acute distress.  Labs including CMP with no acute electrolyte abnormality.  CBC with no leukocytosis, does show anemia 10.  Urinalysis with trace leukocyte.  hCG quantitative ordered, elevated at 11.  Transabdominal and transvaginal ultrasound findings are consistent with pregnancy of unknown location and may reflect early intrauterine pregnancy not yet visualized sonographically, occult ectopic pregnancy or failed pregnancy.  Patient is stable at this point.  She reports mild abdominal cramping but no abnormal vaginal discharge or bleeding.  Advised patient to call her OB/GYN today to make an appointment for tomorrow for repeat hCG.  Advised patient to take Tylenol for pain/cramping, follow-up with OB/GYN for further evaluation management.  Strict return precaution given. I have reviewed the patient home medicines and have made adjustments as needed.  Cardiac monitoring/EKG: The patient was maintained on a cardiac monitor.  I personally reviewed and interpreted the cardiac monitor which showed an underlying rhythm of: sinus rhythm.  Additional history obtained: External records from outside source obtained and reviewed including: Chart review including previous notes, labs,  imaging.  Consultations obtained:  Disposition Continued outpatient therapy. Follow-up with PCP recommended for reevaluation of symptoms. Treatment plan discussed with patient.  Pt acknowledged understanding was agreeable to the plan. Worrisome signs and symptoms were discussed with patient, and patient acknowledged understanding to return to the ED if they noticed these signs and symptoms. Patient was stable upon discharge.   This chart was dictated using voice recognition software.  Despite best efforts to proofread,  errors can occur which can change the documentation meaning.          Final Clinical Impression(s) / ED Diagnoses Final diagnoses:  Positive pregnancy test  Epigastric pain    Rx / DC Orders ED Discharge Orders     None  Jeanelle Malling, PA 10/31/22 1433    Lonell Grandchild, MD 11/08/22 [phone]

## 2022-10-31 NOTE — Discharge Instructions (Addendum)
Please call your OB/GYN today to make an appointment for tomorrow for repeat hCG test to rule out ectopic pregnancy.

## 2022-10-31 NOTE — ED Triage Notes (Signed)
Upper abd pain  x 3 days  denies n/v/d  having cramping pain .  Loose bm yesterday

## 2022-10-31 NOTE — ED Notes (Signed)
Urine specimen collected.

## 2022-11-02 ENCOUNTER — Ambulatory Visit (INDEPENDENT_AMBULATORY_CARE_PROVIDER_SITE_OTHER): Payer: Medicaid Other | Admitting: *Deleted

## 2022-11-02 VITALS — BP 105/68 | HR 64 | Ht 62.0 in | Wt 136.5 lb

## 2022-11-02 DIAGNOSIS — Z3A00 Weeks of gestation of pregnancy not specified: Secondary | ICD-10-CM

## 2022-11-02 DIAGNOSIS — O3680X0 Pregnancy with inconclusive fetal viability, not applicable or unspecified: Secondary | ICD-10-CM

## 2022-11-02 LAB — BETA HCG QUANT (REF LAB): hCG Quant: 52 m[IU]/mL

## 2022-11-02 NOTE — Progress Notes (Signed)
Here for stat bhcg. Denies pain. Denies bleeding. Explained we will draw stat bhcg and have her leave office. She will be called with results and plan of care in several hours after results received and reviewed by provider. She voices understanding.  Emika Tiano,RN  12:00 bhcg results received and reviewed with Dorathy Kinsman. I called Kylina and informed her per provider her bhcg showed appropriate rise and confirms pregnancy. I informed we advise getting a viability [place] in 2 weeks or so. I scheduled [place] for 11/21/22. I also advised if severe pain or bleeding to go to hospital for evaluation. I also advised she can start prenatal care once [place] verifies viability and EDD. She voices understanding. Nancy Fetter

## 2022-11-07 ENCOUNTER — Telehealth: Payer: Medicaid Other | Admitting: Family Medicine

## 2022-11-07 DIAGNOSIS — O3680X0 Pregnancy with inconclusive fetal viability, not applicable or unspecified: Secondary | ICD-10-CM

## 2022-11-07 NOTE — Progress Notes (Signed)
[place]   Recent + HGC test-Pregnant with odor and discharge. Needs in person for swab and eval.  Patient acknowledged agreement and understanding of the plan.

## 2022-11-10 ENCOUNTER — Ambulatory Visit (INDEPENDENT_AMBULATORY_CARE_PROVIDER_SITE_OTHER): Payer: Medicaid Other

## 2022-11-10 ENCOUNTER — Other Ambulatory Visit: Payer: Self-pay

## 2022-11-10 DIAGNOSIS — N760 Acute vaginitis: Secondary | ICD-10-CM

## 2022-11-10 NOTE — Progress Notes (Signed)
Patricia Craig, originally scheduled appointment earlier in the week for concerns of smelly discharge; however, patient states that the symptom has subsided for the past 2-3 days. Patient denied any other symptoms--including changes in vaginal discharge texture and color, and no vaginal itching. Patient declined self-swab today and had no other concerns; she voiced that she was going to try and cancel the appointment this morning, but the automated system stated that she could only cancel at least 24 hours before the appointment, and that's why she still came in. Requested that we cancel appointment after being pulled back for intake; appointment was canceled with administrative staff.   Encouraged patient to schedule annual visit; informed patient that her last annual visit with [place] was 07/2021; patient verbalized understanding.   Consulted with clinical team; agreed that no vitals needed to be taken since appointment was canceled and no treatment/testing performed per patient declination.     Meryl Crutch, RN 11/10/2022  8:20 AM

## 2022-11-21 ENCOUNTER — Other Ambulatory Visit: Payer: Medicaid Other

## 2022-11-21 ENCOUNTER — Ambulatory Visit (INDEPENDENT_AMBULATORY_CARE_PROVIDER_SITE_OTHER): Payer: Medicaid Other

## 2022-11-21 DIAGNOSIS — O3680X0 Pregnancy with inconclusive fetal viability, not applicable or unspecified: Secondary | ICD-10-CM

## 2022-11-21 DIAGNOSIS — Z3A01 Less than 8 weeks gestation of pregnancy: Secondary | ICD-10-CM

## 2022-11-21 DIAGNOSIS — Z3481 Encounter for supervision of other normal pregnancy, first trimester: Secondary | ICD-10-CM | POA: Diagnosis not present

## 2022-11-21 DIAGNOSIS — O019 Hydatidiform mole, unspecified: Secondary | ICD-10-CM

## 2022-11-21 IMAGING — US US OB TRANSVAGINAL
1 series · 15 of 17 positions shown · non-contrast
Comparison: None.

CLINICAL DATA: Hyperemesis

EXAM:
OBSTETRIC <14 WK US
TECHNIQUE: Both transabdominal and transvaginal ultrasound examinations were
performed for complete evaluation of the gestation as well as the
maternal uterus, adnexal regions, and pelvic cul-de-sac.
Transvaginal technique was performed to assess early pregnancy.

[Series 1: us ob transvaginal · 15 of 17 slices shown]
[im 1/17]
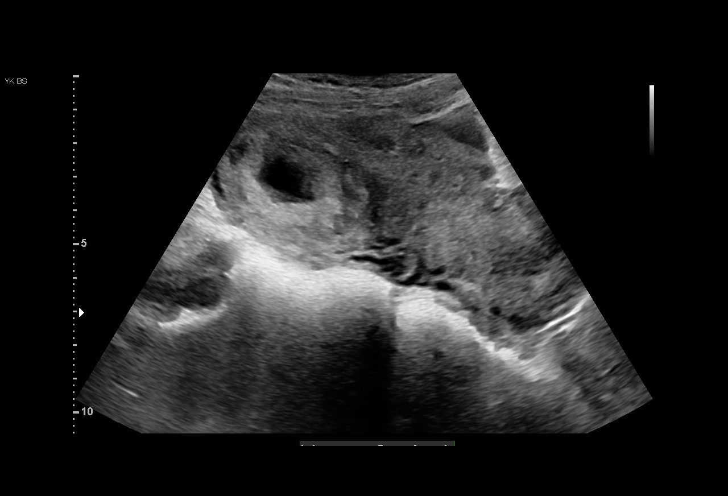
[im 2/17]
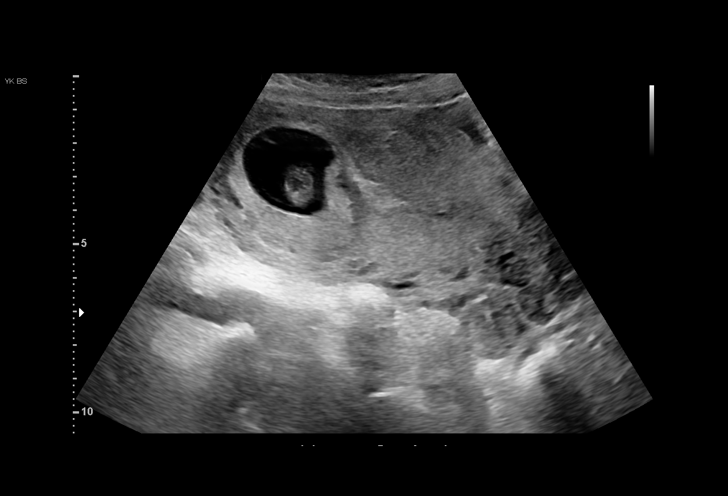
[im 3/17]
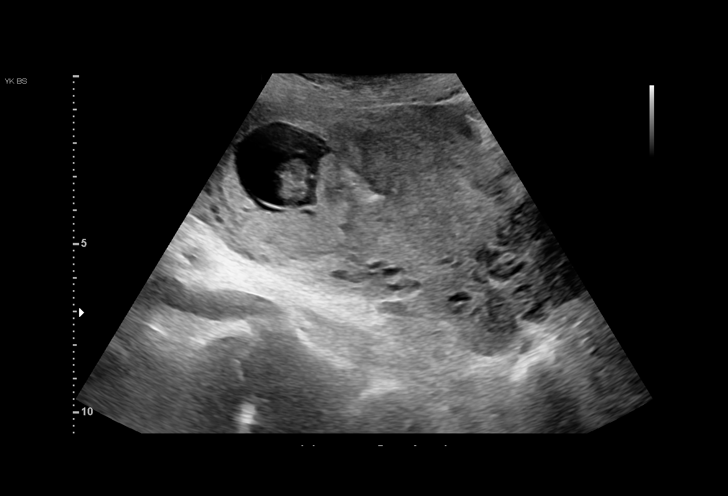
[im 4/17]
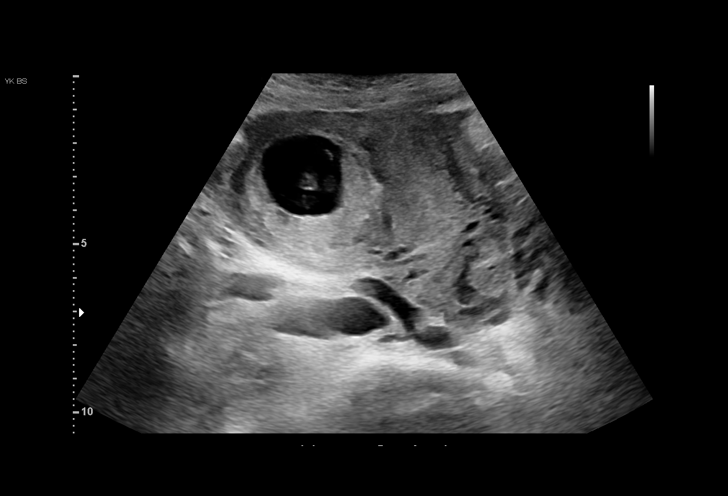
[im 6/17]
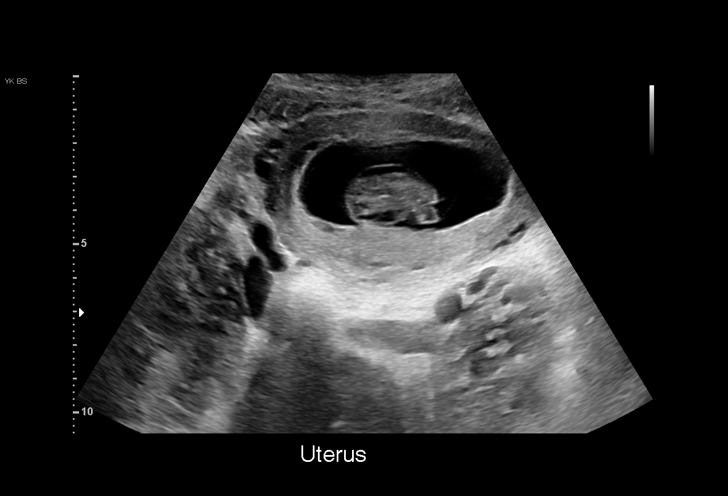
[im 7/17]
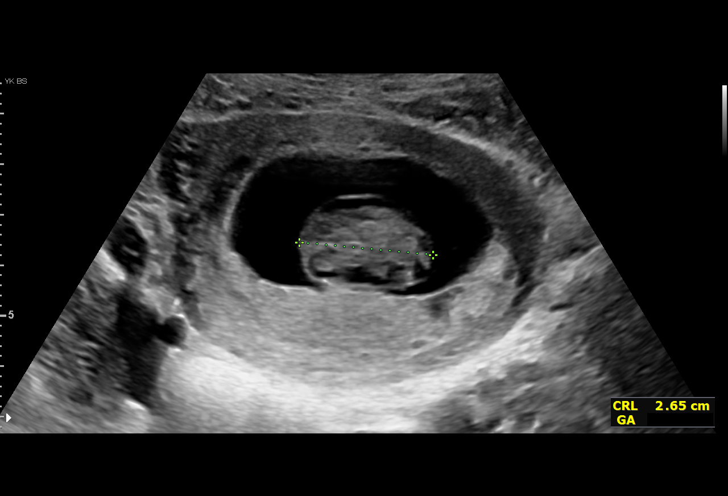
[im 8/17]
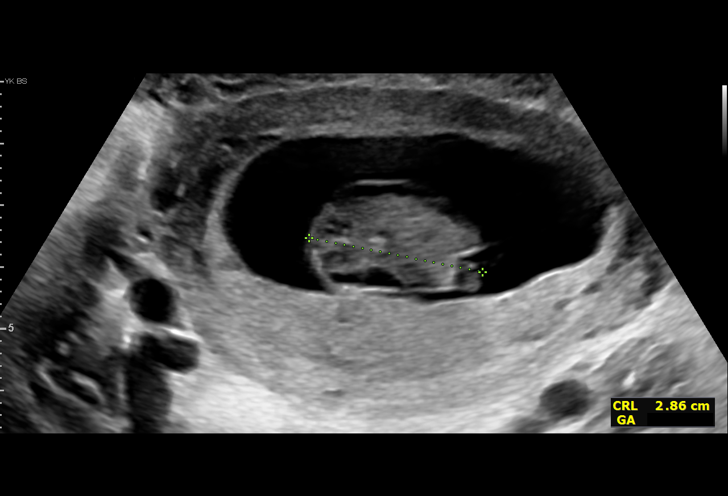
[im 9/17]
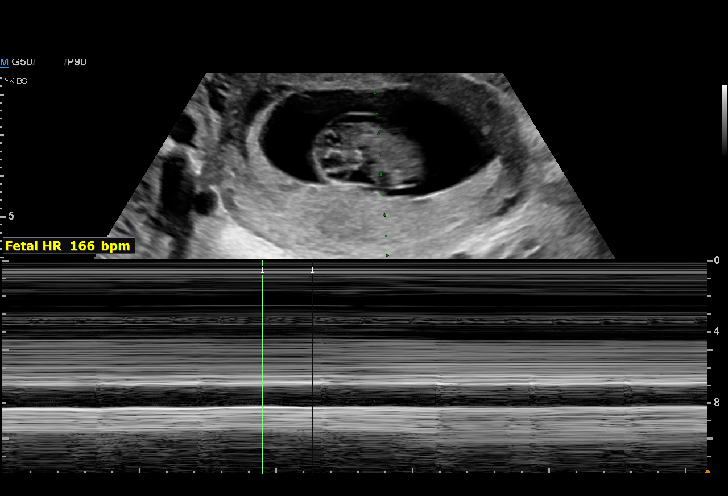
[im 10/17]
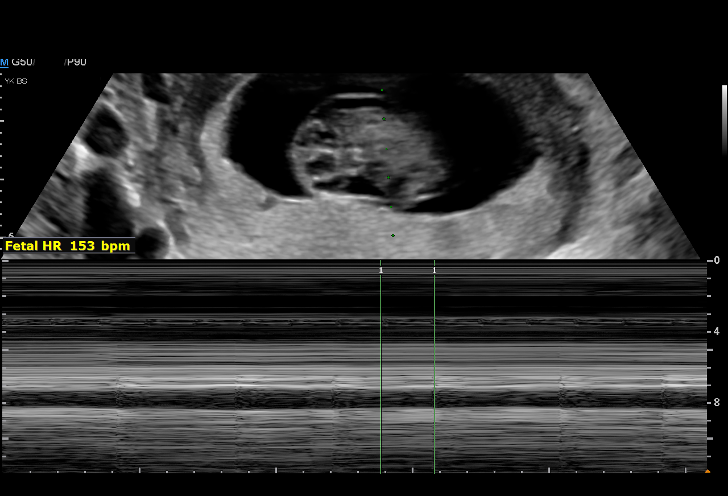
[im 11/17]
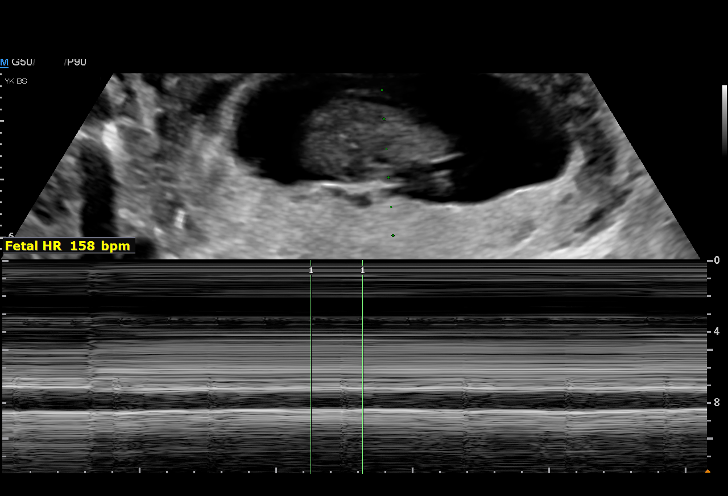
[im 12/17]
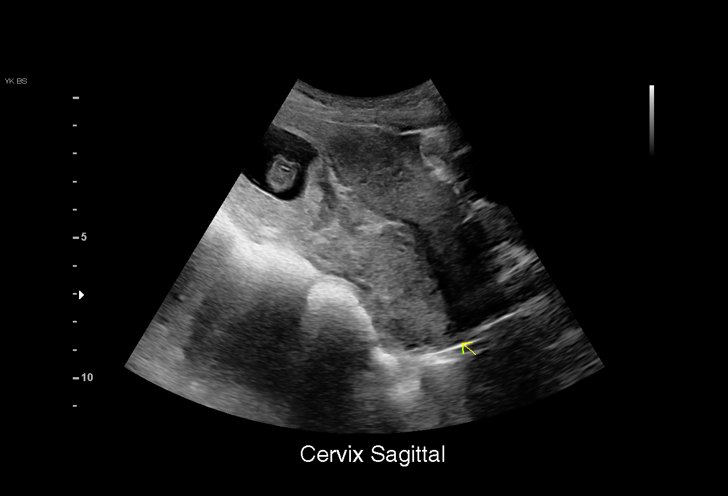
[im 14/17]
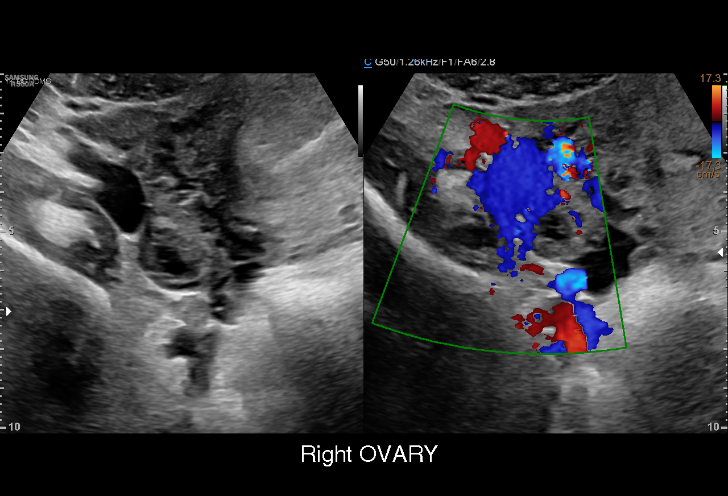
[im 15/17]
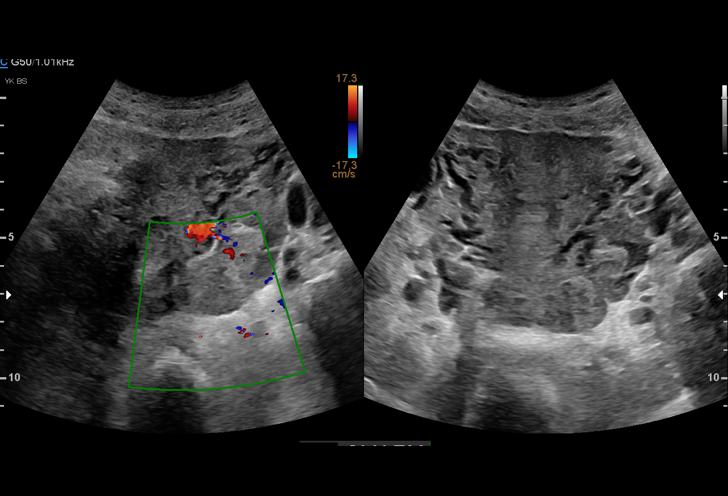
[im 16/17]
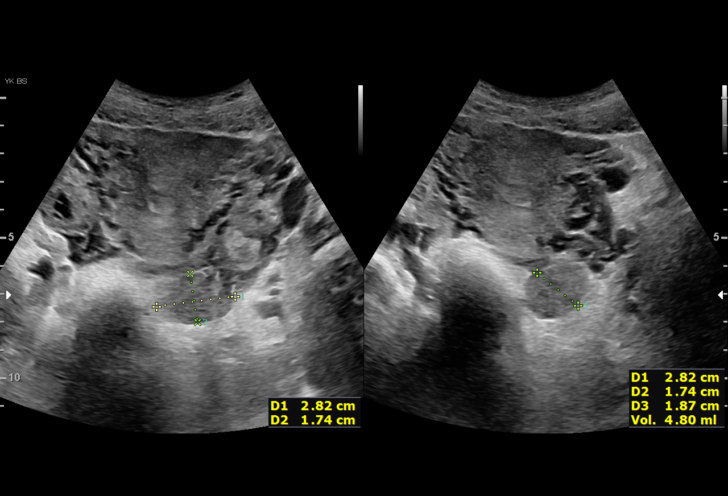
[im 17/17]
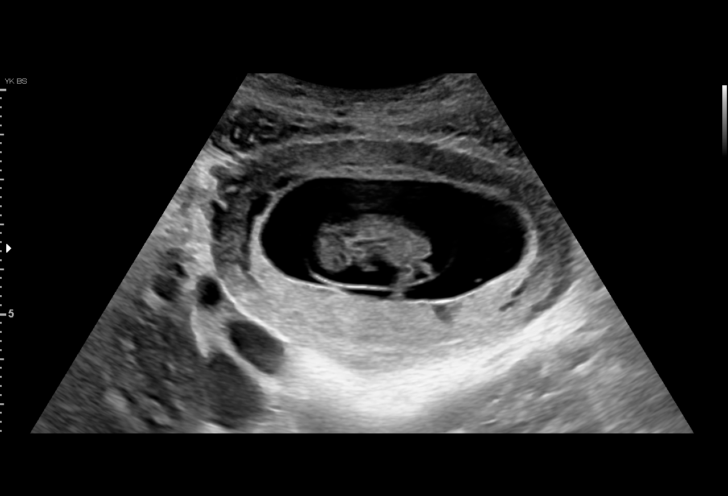

[15 of 17 positions shown; findings below may reference images not displayed]

FINDINGS: Intrauterine gestational sac: Single

Yolk sac:  Visualized.

Embryo:  Visualized.

Cardiac Activity: Visualized.

Heart Rate: 159 bpm

CRL:  27.5 mm   9 w   4 d                  US EDC: 01/17/2022

Subchorionic hemorrhage:  None visualized.

Maternal uterus/adnexae: Normal.
IMPRESSION: Single intrauterine pregnancy corresponding to 9 weeks and 4 days
gestation. No complicating features seen.

## 2022-11-28 ENCOUNTER — Encounter: Payer: Self-pay | Admitting: Advanced Practice Midwife

## 2022-12-15 IMAGING — US US OB COMP LESS 14 WK
1 series · 15 of 20 positions shown · non-contrast
Comparison: None.

CLINICAL DATA: Viability, fetal heart tone not found

EXAM:
OBSTETRIC <14 WK ULTRASOUND
TECHNIQUE: Transabdominal ultrasound was performed for evaluation of the
gestation as well as the maternal uterus and adnexal regions.

[Series 1: us ob comp less 14 wk · 20 acquisitions, 15 frames shown]
[im 1/20]
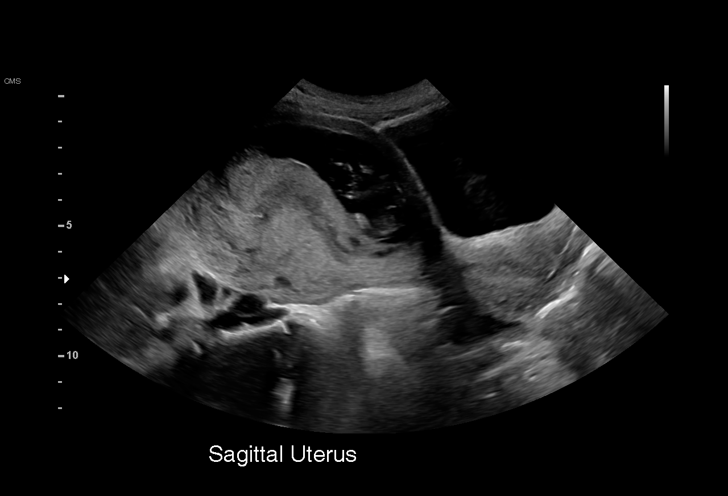
[im 3/20]
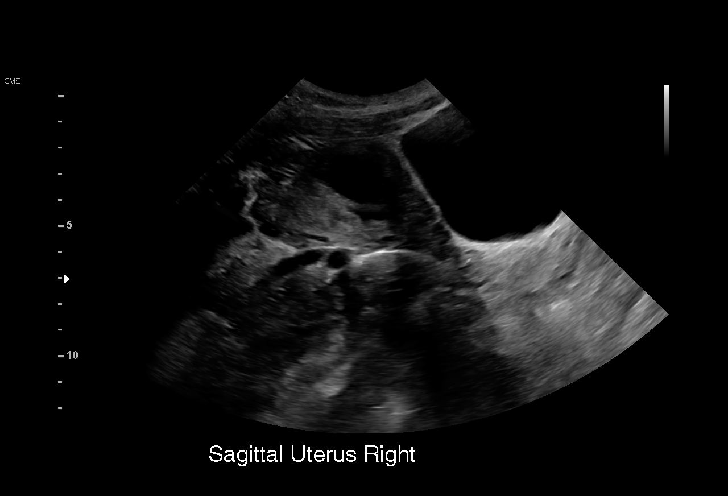
[im 4/20]
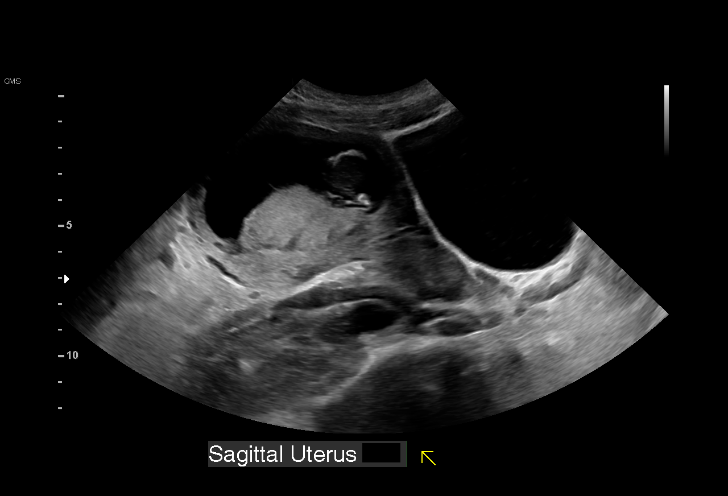
[im 5/20]
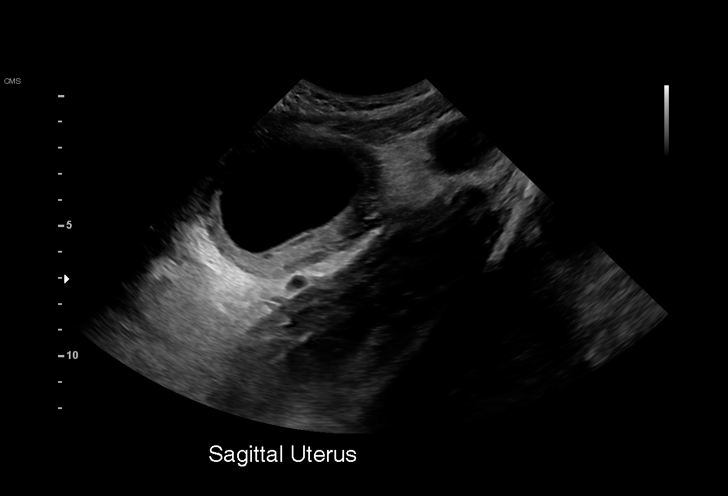
[im 7/20]
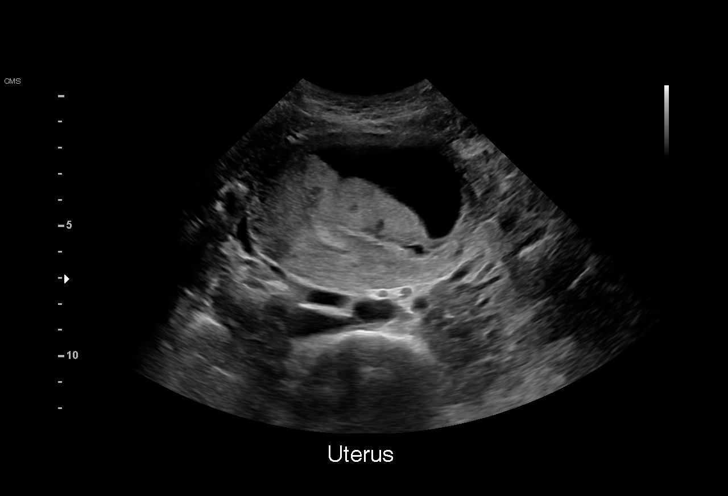
[im 8/20]
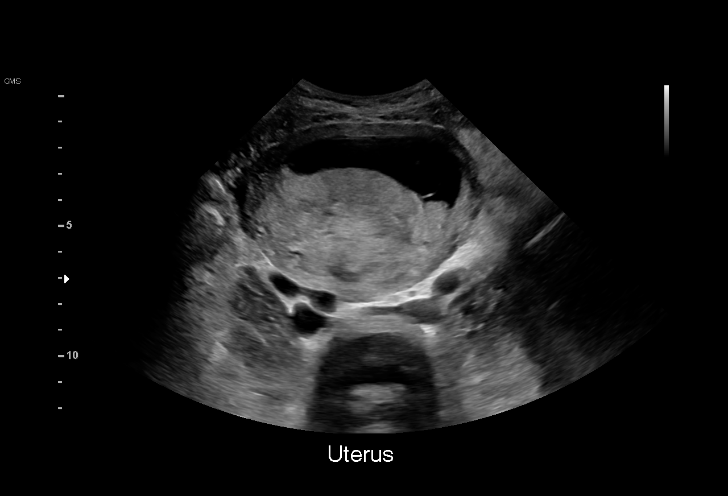
[im 9/20]
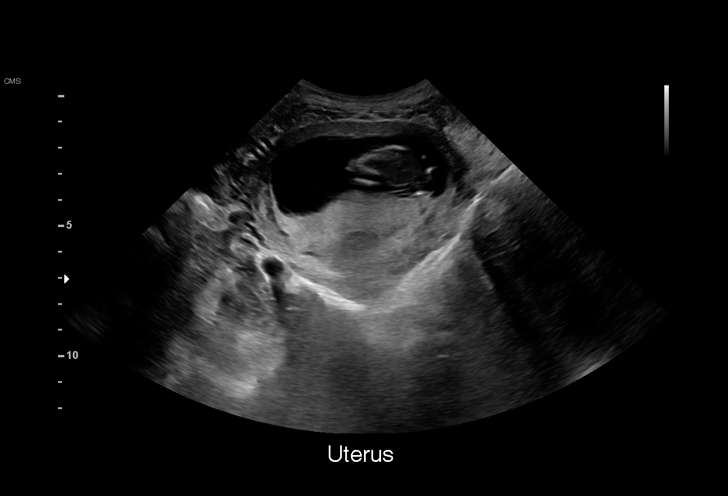
[im 11/20]
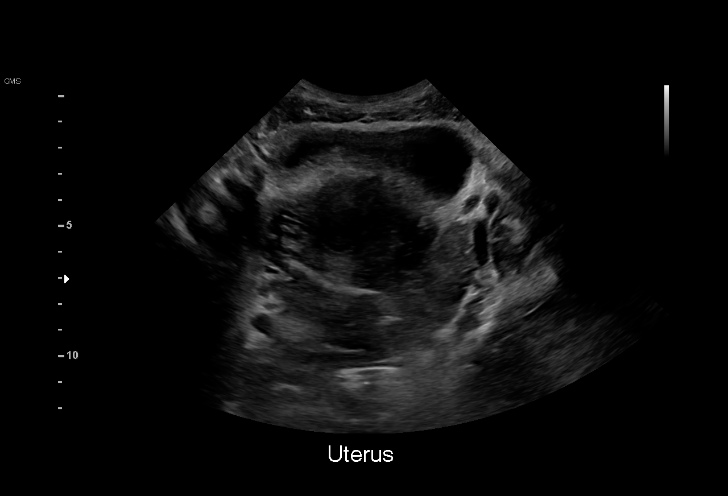
[im 12/20]
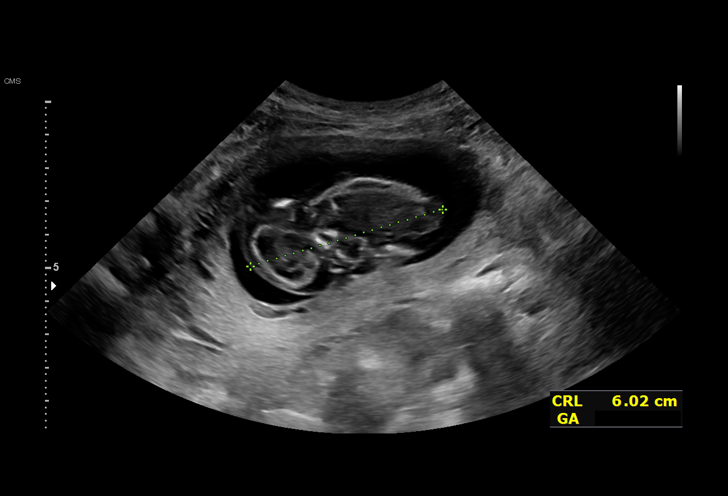
[im 13/20]
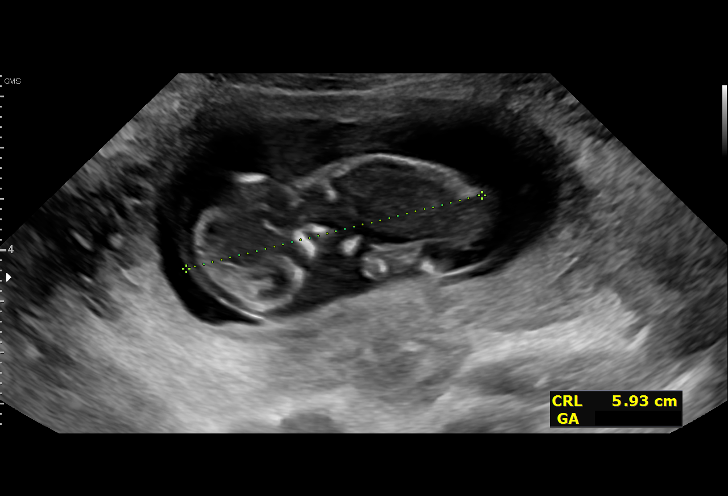
[im 15/20]
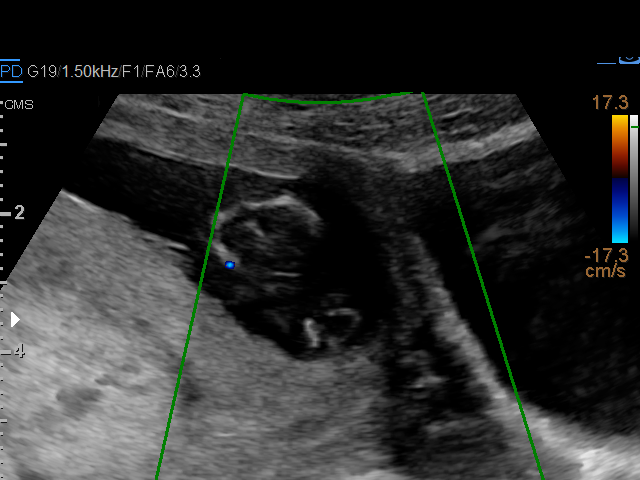
[im 16/20]
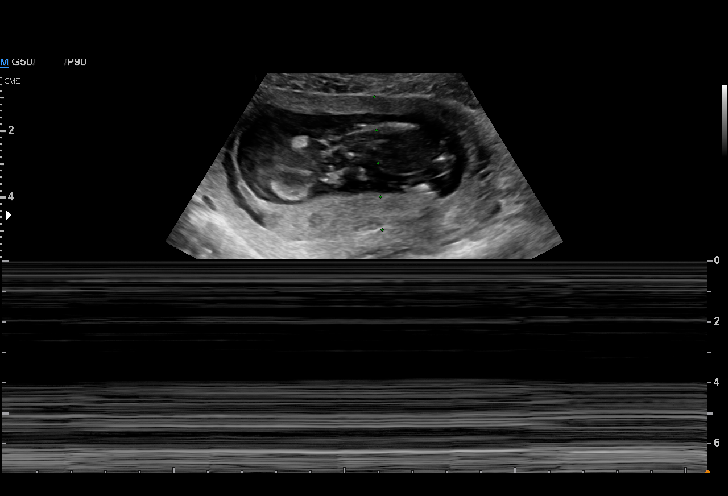
[im 17/20]
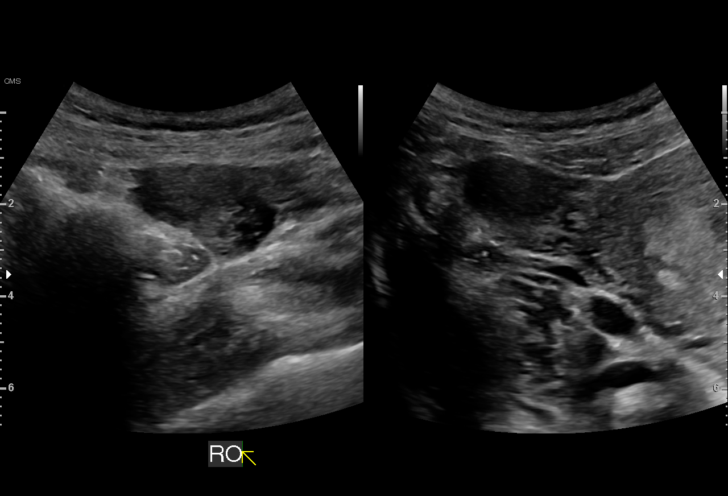
[im 19/20]
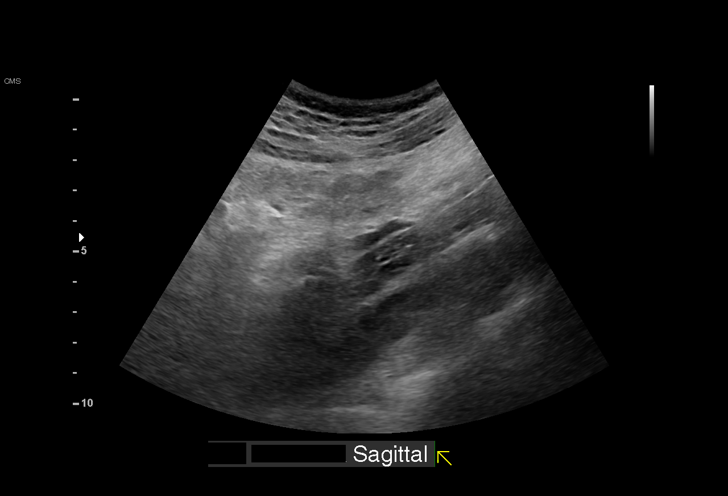
[im 20/20]
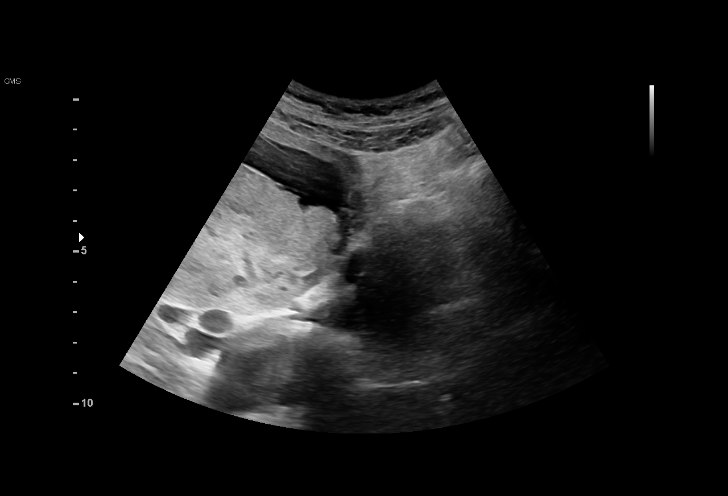

[15 of 20 positions shown; findings below may reference images not displayed]

FINDINGS: Intrauterine gestational sac: Single

Yolk sac:  Not Visualized.

Embryo:  Visualized.

Cardiac Activity: Not Visualized.

Heart Rate: 0 bpm

CRL: 59.1  mm   12 w 3 d                  US EDC: 01/21/2022

Subchorionic hemorrhage:  None visualized.

Maternal uterus/adnexae: No adnexal abnormality.
IMPRESSION: Single intrauterine pregnancy. Findings consistent with intrauterine
fetal demise. Findings meet definitive criteria for failed
pregnancy. This follows SRU consensus guidelines: Diagnostic
Criteria for Nonviable Pregnancy Early in the First Trimester. N
Engl J Med 6495;[DATE].

## 2022-12-21 ENCOUNTER — Telehealth: Payer: Medicaid Other

## 2022-12-28 ENCOUNTER — Encounter: Payer: Self-pay | Admitting: Obstetrics and Gynecology

## 2023-01-08 ENCOUNTER — Other Ambulatory Visit: Payer: Self-pay

## 2023-01-08 ENCOUNTER — Inpatient Hospital Stay (HOSPITAL_COMMUNITY)
Admission: AD | Admit: 2023-01-08 | Discharge: 2023-01-08 | Disposition: A | Discharge status: Home / Self Care | Payer: Medicaid Other | Attending: Family Medicine | Admitting: Family Medicine

## 2023-01-08 ENCOUNTER — Ambulatory Visit: Payer: Self-pay | Admitting: *Deleted

## 2023-01-08 DIAGNOSIS — R7989 Other specified abnormal findings of blood chemistry: Secondary | ICD-10-CM

## 2023-01-08 DIAGNOSIS — N760 Acute vaginitis: Secondary | ICD-10-CM | POA: Insufficient documentation

## 2023-01-08 DIAGNOSIS — B9689 Other specified bacterial agents as the cause of diseases classified elsewhere: Secondary | ICD-10-CM | POA: Diagnosis not present

## 2023-01-08 LAB — URINALYSIS, ROUTINE W REFLEX MICROSCOPIC
Bilirubin Urine: NEGATIVE
Glucose, UA: NEGATIVE mg/dL
Ketones, ur: NEGATIVE mg/dL
Leukocytes,Ua: NEGATIVE
Nitrite: NEGATIVE
Protein, ur: >=300 mg/dL — AB
RBC / HPF: >50 RBC/hpf (ref 0–5)
Specific Gravity, Urine: 1.026 (ref 1.005–1.030)
pH: 5 (ref 5.0–8.0)

## 2023-01-08 LAB — WET PREP, GENITAL
Sperm: NONE SEEN
Trich, Wet Prep: NONE SEEN
WBC, Wet Prep HPF POC: <10 (ref <10)
Yeast Wet Prep HPF POC: NONE SEEN

## 2023-01-08 LAB — HCG, QUANTITATIVE, PREGNANCY: hCG, Beta Chain, Quant, S: 61 m[IU]/mL — ABNORMAL HIGH (ref <5)

## 2023-01-08 LAB — POCT PREGNANCY, URINE: Preg Test, Ur: POSITIVE — AB

## 2023-01-08 MED ORDER — METRONIDAZOLE 0.75 % VA GEL: 1.0000 | Freq: Every day | VAGINAL | 1 refills | Status: DC

## 2023-01-08 NOTE — Telephone Encounter (Signed)
Pt called back, said she had missed the call and had arrived to somewhere she needed to take care of some things and will give [place] a call back when she is available to talk.

## 2023-01-08 NOTE — Telephone Encounter (Signed)
Summary: spotting/discharge is brown with an odor.   Pt stated she had a medical abortion on May 14th and hasn't had a real period but is spotting/discharge is brown with an odor.  Pt seeking clinical advice.

## 2023-01-08 NOTE — MAU Note (Signed)
Patricia Craig is a 27 y.o. at [redacted]w[redacted]d here in MAU reporting: she was in Urgent Care today for c/o vaginal odor.  States provider did pelvic exam and after inserting speculum she began passing blood clots.  States was instructed to be seen in MAU secondary may have retained POC.  Pt reports she had an abortion on 11/28/2022, hasn't had a cycle since procedure. LMP: end of March Onset of complaint: 2 weeks vag odor Pain score: 0 Vitals:   01/08/23 1829  BP: 117/72  Pulse: 75  Resp: 17  Temp: 98 F (36.7 C)  SpO2: 100%     FHT:NA Lab orders placed from triage:   UA

## 2023-01-08 NOTE — MAU Provider Note (Signed)
History     CSN: [id number]  Arrival date and time: 01/08/23 1734   Event Date/Time   First Provider Initiated Contact with Patient 01/08/23 2101      No chief complaint on file.   Patricia Craig is a 27 y.o. G3P1011 at unknown gestation.  She presents today for vaginal bleeding.  Patient states she had TAB via pill on 5/14.  She states she had bleeding for weeks after, but then nothing for the past 3 weeks.  She states she was unable to go in for follow up d/t child care issues.  Patient states she started having vaginal odor ~ 2 weeks ago with pinkish brown spotting.  She states she wasn't use to the smell and today went to urgent care for evaluation.  She states she was not aware of heavy bleeding prior to going to UC. She states she was not able to see the clots, but was told that she had multiple blood clots.  She states she has been sexually active since her TAB. She states she has been using condoms.   OB History     Gravida  3   Para  1   Term  1   Preterm      AB  1   Living  1      SAB      IAB      Ectopic      Multiple  0   Live Births  1           Past Medical History:  Diagnosis Date   Anxiety    Herpes simplex type 2 infection     Past Surgical History:  Procedure Laterality Date   DILATION AND EVACUATION N/A 07/12/2021   Procedure: DILATATION AND EVACUATION;  Surgeon: Lazaro Arms, MD;  Location: [facility] OR;  Service: Gynecology;  Laterality: N/A;   MOUTH SURGERY      No family history on file.  Social History   Tobacco Use   Smoking status: Never   Smokeless tobacco: Never  Vaping Use   Vaping Use: Never used  Substance Use Topics   Alcohol use: No   Drug use: Not Currently    Types: Marijuana    Comment: 2021    Allergies: No Known Allergies  No medications prior to admission.    Review of Systems  Gastrointestinal:  Negative for nausea and vomiting.  Genitourinary:  Positive for vaginal bleeding. Negative for  difficulty urinating, dysuria and vaginal discharge.   Physical Exam   Blood pressure 117/72, pulse 75, temperature 98 F (36.7 C), temperature source Oral, resp. rate 17, height 5\' 2"  (1.575 m), weight 63.5 kg, SpO2 100 %.  Physical Exam Vitals reviewed.  Constitutional:      Appearance: Normal appearance.  HENT:     Head: Normocephalic and atraumatic.  Eyes:     Conjunctiva/sclera: Conjunctivae normal.  Cardiovascular:     Rate and Rhythm: Normal rate.  Pulmonary:     Effort: Pulmonary effort is normal. No respiratory distress.  Musculoskeletal:        General: Normal range of motion.     Cervical back: Normal range of motion.  Skin:    General: Skin is warm and dry.  Neurological:     Mental Status: She is alert and oriented to person, place, and time.  Psychiatric:        Mood and Affect: Mood normal.        Behavior: Behavior normal.  MAU Course  Procedures Results for orders placed or performed during the hospital encounter of 01/08/23 (from the past 24 hour(s))  Wet prep, genital     Status: Abnormal   Collection Time: 01/08/23  6:58 PM  Result Value Ref Range   Yeast Wet Prep HPF POC NONE SEEN NONE SEEN   Trich, Wet Prep NONE SEEN NONE SEEN   Clue Cells Wet Prep HPF POC PRESENT (A) NONE SEEN   WBC, Wet Prep HPF POC <10 <10   Sperm NONE SEEN   Pregnancy, urine POC     Status: Abnormal   Collection Time: 01/08/23  7:00 PM  Result Value Ref Range   Preg Test, Ur POSITIVE (A) NEGATIVE  Urinalysis, Routine w reflex microscopic -Urine, Clean Catch     Status: Abnormal   Collection Time: 01/08/23  7:02 PM  Result Value Ref Range   Color, Urine AMBER (A) YELLOW   APPearance CLOUDY (A) CLEAR   Specific Gravity, Urine 1.026 1.005 - 1.030   pH 5.0 5.0 - 8.0   Glucose, UA NEGATIVE NEGATIVE mg/dL   Hgb urine dipstick LARGE (A) NEGATIVE   Bilirubin Urine NEGATIVE NEGATIVE   Ketones, ur NEGATIVE NEGATIVE mg/dL   Protein, ur >=782 (A) NEGATIVE mg/dL   Nitrite  NEGATIVE NEGATIVE   Leukocytes,Ua NEGATIVE NEGATIVE   RBC / HPF >50 0 - 5 RBC/hpf   WBC, UA 0-5 0 - 5 WBC/hpf   Bacteria, UA RARE (A) NONE SEEN   Squamous Epithelial / HPF 0-5 0 - 5 /HPF   Mucus PRESENT    Budding Yeast PRESENT   hCG, quantitative, pregnancy     Status: Abnormal   Collection Time: 01/08/23  7:14 PM  Result Value Ref Range   hCG, Beta Chain, Quant, S 61 (H) <5 mIU/mL    MDM Labs: UA, hCG Cultures: Wet Prep, GC/CT Prescription Coordination of Follow Up Assessment and Plan  27 year old, G3P1011  Elevated hCG Vaginal Odor  -Orders placed by previous provider-J. Stinson, DO. -Provider to bedside to discuss.  -Reviewed findings with patient. -Informed that provider could not definitively state if findings are from previous TAB or new pregnancy. -Discussed [place] today to r/o retained products.  -Also given option of performing [place] in 48 hours after repeat hCG. -Patient desires to wait and return Wednesday evening around 1900. -Discussed possible outcomes including decreasing, increasing, and stable hCG level. -Briefly discussed MTX dosing. -Patient questions if retained products is possible and informed it is, but in absence of previous hCG levels, provider could not definitively give diagnosis.  -Patient verbalizes understanding. -Informed of clue cells c/w BV and discussed treatment options.  Patient desires metrogel. Rx sent to pharmacy on file.  -Precautions reviewed. -Patient to return to MAU on Wednesday June 26th at 1900 for repeat hCG and [place] as appropriate.   Cherre Robins 01/08/2023, 9:01 PM

## 2023-01-09 LAB — GC/CHLAMYDIA PROBE AMP (~~LOC~~) NOT AT ARMC
Chlamydia: NEGATIVE
Comment: NEGATIVE
Comment: NORMAL
Neisseria Gonorrhea: NEGATIVE

## 2023-01-10 ENCOUNTER — Inpatient Hospital Stay (HOSPITAL_COMMUNITY): Admission: RE | Admit: 2023-01-10 | Payer: Medicaid Other | Source: Ambulatory Visit

## 2023-01-10 ENCOUNTER — Observation Stay (HOSPITAL_COMMUNITY)
Admission: AD | Admit: 2023-01-10 | Discharge: 2023-01-11 | Disposition: A | Discharge status: Home / Self Care | Payer: Medicaid Other | Attending: Obstetrics and Gynecology | Admitting: Obstetrics and Gynecology

## 2023-01-10 ENCOUNTER — Inpatient Hospital Stay (HOSPITAL_COMMUNITY): Payer: Medicaid Other

## 2023-01-10 DIAGNOSIS — O034 Incomplete spontaneous abortion without complication: Secondary | ICD-10-CM | POA: Diagnosis not present

## 2023-01-10 DIAGNOSIS — D649 Anemia, unspecified: Secondary | ICD-10-CM

## 2023-01-10 DIAGNOSIS — D62 Acute posthemorrhagic anemia: Secondary | ICD-10-CM | POA: Diagnosis not present

## 2023-01-10 DIAGNOSIS — Z3A13 13 weeks gestation of pregnancy: Secondary | ICD-10-CM | POA: Diagnosis not present

## 2023-01-10 LAB — CBC
HCT: 20.8 % — ABNORMAL LOW (ref 36.0–46.0)
Hemoglobin: 6.1 g/dL — CL (ref 12.0–15.0)
MCH: 21.5 pg — ABNORMAL LOW (ref 26.0–34.0)
MCHC: 29.3 g/dL — ABNORMAL LOW (ref 30.0–36.0)
MCV: 73.2 fL — ABNORMAL LOW (ref 80.0–100.0)
Platelets: 305 10*3/uL (ref 150–400)
RBC: 2.84 MIL/uL — ABNORMAL LOW (ref 3.87–5.11)
RDW: 17.4 % — ABNORMAL HIGH (ref 11.5–15.5)
WBC: 5.6 10*3/uL (ref 4.0–10.5)
nRBC: 0 % (ref 0.0–0.2)

## 2023-01-10 LAB — COMPREHENSIVE METABOLIC PANEL
ALT: 9 U/L (ref 0–44)
AST: 16 U/L (ref 15–41)
Albumin: 3.5 g/dL (ref 3.5–5.0)
Alkaline Phosphatase: 36 U/L — ABNORMAL LOW (ref 38–126)
Anion gap: 7 (ref 5–15)
BUN: 9 mg/dL (ref 6–20)
CO2: 25 mmol/L (ref 22–32)
Calcium: 8.7 mg/dL — ABNORMAL LOW (ref 8.9–10.3)
Chloride: 105 mmol/L (ref 98–111)
Creatinine, Ser: 0.7 mg/dL (ref 0.44–1.00)
GFR, Estimated: >60 mL/min (ref >60)
Glucose, Bld: 94 mg/dL (ref 70–99)
Potassium: 3.7 mmol/L (ref 3.5–5.1)
Sodium: 137 mmol/L (ref 135–145)
Total Bilirubin: 1 mg/dL (ref 0.3–1.2)
Total Protein: 6.6 g/dL (ref 6.5–8.1)

## 2023-01-10 LAB — HCG, QUANTITATIVE, PREGNANCY: hCG, Beta Chain, Quant, S: 39 m[IU]/mL — ABNORMAL HIGH (ref <5)

## 2023-01-10 NOTE — MAU Note (Addendum)
Patricia Craig is a 27 y.o. at ?[redacted]w[redacted]d here in MAU reporting: pt here for f/u blood work and possible [place].  Was seen 6/24- +preg, HCG 61, unable to give definite answer whether this a new preg, or retained POC post TAB (with pills) 5/14, pt did not f/u. Pt today reports mild cramping, she is bleeding, sees it when using the restroom and passing quarter sized clots.  Onset of complaint: ongoing Pain score: mild Vitals:   01/10/23 1756  BP: 109/66  Pulse: 88  Resp: 16  Temp: 98.6 F (37 C)  SpO2: 100%      Lab orders placed from triage:  ordered by APP  Pt to family rm to wait on blood draw

## 2023-01-10 NOTE — Progress Notes (Signed)
CRITICAL VALUE STICKER  CRITICAL VALUE: hgb 6.1  RECEIVER (on-site recipient of call): Boone Master, RN   DATE & TIME NOTIFIED: 01/10/23 1950  MESSENGER (representative from lab): Zollie Beckers, lab tech   MD NOTIFIED: Dr. Crissie Reese  TIME OF NOTIFICATION: 1951  RESPONSE: no new orders at this time.

## 2023-01-10 NOTE — H&P (Signed)
[facility] Attending History and Physical  Patricia Craig is a 27 y.o. G3P1011 admitted for retained POCs after AB and symptomatic anemia.   Med AB 5/14. Had cramping bleeding & passed pregnancy tissue. Went 2-3 weeks without bleeding, then started having abnormal BV-like discharge. Now with 2-3 days of heavy bleeding. Seen in MAU 2 days ago, BhCG 61. No US/Hgb performed at that time. Returned today for planned repeat BhCG. BhCG now 39, Hgb 6.1. On chart review, Hgb was 10.0 in April (~2 months ago). Pelvic US w/ 14mm EL with vascularity concerning for rPOCs. She states she has been sexually active since her TAB. She states she has been using condoms.   Today, reports ongoing vaginal bleeding. Is change pads every 2 hours (not saturated) and passing some clots. Reports significant fatigue.   Denies any fevers, chills, sweats, dysuria, nausea, vomiting, other GI or GU symptoms or other general symptoms.  Past Medical History:  Diagnosis Date   Anxiety    Herpes simplex type 2 infection    Past Surgical History:  Procedure Laterality Date   DILATION AND EVACUATION N/A 07/12/2021   Procedure: DILATATION AND EVACUATION;  Surgeon: Lazaro Arms, MD;  Location: [facility] OR;  Service: Gynecology;  Laterality: N/A;   MOUTH SURGERY     OB History  Gravida Para Term Preterm AB Living  3 1 1   1 1   SAB IAB Ectopic Multiple Live Births        0 1    # Outcome Date GA Lbr Len/2nd Weight Sex Delivery Anes PTL Lv  3 Current           2 AB 07/2021 [redacted]w[redacted]d         1 Term 12/05/17 [redacted]w[redacted]d 08:45 / 00:17 2730 g M Vag-Spont None  LIV  Patient denies any other pertinent gynecologic issues.  No current facility-administered medications on file prior to encounter.   No current outpatient medications on file prior to encounter.   No Known Allergies  Social History:   reports that she has never smoked. She has never used smokeless tobacco. She reports that she does not currently  use drugs after having used the following drugs: Marijuana. She reports that she does not drink alcohol. No family history on file.  Review of Systems: Pertinent items noted in HPI and remainder of comprehensive ROS otherwise negative.  PHYSICAL EXAM: Blood pressure 109/66, pulse 88, temperature 98.6 F (37 C), temperature source Oral, resp. rate 16, height 5\' 2"  (1.575 m), weight 62.6 kg, SpO2 100 %. CONSTITUTIONAL: Well-developed, well-nourished female in no acute distress.  CARDIOVASCULAR: Normal heart rate noted, regular rhythm RESPIRATORY: Effort normal, no problems with respiration noted ABDOMEN: Soft, nontender, nondistended PELVIC: Normal appearing external genitalia; normal appearing vaginal mucosa and cervix. 15-20cc of dark blood in vaginal vault, dark blood at cervical os but no active bleeding.   Labs: Results for orders placed or performed during the hospital encounter of 01/10/23 (from the past 336 hour(s))  hCG, quantitative, pregnancy   Collection Time: 01/10/23  6:17 PM  Result Value Ref Range   hCG, Beta Chain, Quant, S 39 (H) <5 mIU/mL  CBC   Collection Time: 01/10/23  6:17 PM  Result Value Ref Range   WBC 5.6 4.0 - 10.5 K/uL   RBC 2.84 (L) 3.87 - 5.11 MIL/uL   Hemoglobin 6.1 (LL) 12.0 - 15.0 g/dL   HCT 45.4 (L) 09.8 - 11.9 %   MCV 73.2 (L) 80.0 -  100.0 fL   MCH 21.5 (L) 26.0 - 34.0 pg   MCHC 29.3 (L) 30.0 - 36.0 g/dL   RDW 02.7 (H) 25.3 - 66.4 %   Platelets 305 150 - 400 K/uL   nRBC 0.0 0.0 - 0.2 %  Comprehensive metabolic panel   Collection Time: 01/10/23  6:17 PM  Result Value Ref Range   Sodium 137 135 - 145 mmol/L   Potassium 3.7 3.5 - 5.1 mmol/L   Chloride 105 98 - 111 mmol/L   CO2 25 22 - 32 mmol/L   Glucose, Bld 94 70 - 99 mg/dL   BUN 9 6 - 20 mg/dL   Creatinine, Ser 4.03 0.44 - 1.00 mg/dL   Calcium 8.7 (L) 8.9 - 10.3 mg/dL   Total Protein 6.6 6.5 - 8.1 g/dL   Albumin 3.5 3.5 - 5.0 g/dL   AST 16 15 - 41 U/L   ALT 9 0 - 44 U/L   Alkaline  Phosphatase 36 (L) 38 - 126 U/L   Total Bilirubin 1.0 0.3 - 1.2 mg/dL   GFR, Estimated >47 >42 mL/min   Anion gap 7 5 - 15  Results for orders placed or performed during the hospital encounter of 01/08/23 (from the past 336 hour(s))  GC/Chlamydia probe amp ([place])not at [facility]   Collection Time: 01/08/23  6:40 PM  Result Value Ref Range   Neisseria Gonorrhea Negative    Chlamydia Negative    Comment Normal Reference Ranger Chlamydia - Negative    Comment      Normal Reference Range Neisseria Gonorrhea - Negative  Wet prep, genital   Collection Time: 01/08/23  6:58 PM  Result Value Ref Range   Yeast Wet Prep HPF POC NONE SEEN NONE SEEN   Trich, Wet Prep NONE SEEN NONE SEEN   Clue Cells Wet Prep HPF POC PRESENT (A) NONE SEEN   WBC, Wet Prep HPF POC <10 <10   Sperm NONE SEEN   Pregnancy, urine POC   Collection Time: 01/08/23  7:00 PM  Result Value Ref Range   Preg Test, Ur POSITIVE (A) NEGATIVE  Urinalysis, Routine w reflex microscopic -Urine, Clean Catch   Collection Time: 01/08/23  7:02 PM  Result Value Ref Range   Color, Urine AMBER (A) YELLOW   APPearance CLOUDY (A) CLEAR   Specific Gravity, Urine 1.026 1.005 - 1.030   pH 5.0 5.0 - 8.0   Glucose, UA NEGATIVE NEGATIVE mg/dL   Hgb urine dipstick LARGE (A) NEGATIVE   Bilirubin Urine NEGATIVE NEGATIVE   Ketones, ur NEGATIVE NEGATIVE mg/dL   Protein, ur >=595 (A) NEGATIVE mg/dL   Nitrite NEGATIVE NEGATIVE   Leukocytes,Ua NEGATIVE NEGATIVE   RBC / HPF >50 0 - 5 RBC/hpf   WBC, UA 0-5 0 - 5 WBC/hpf   Bacteria, UA RARE (A) NONE SEEN   Squamous Epithelial / HPF 0-5 0 - 5 /HPF   Mucus PRESENT    Budding Yeast PRESENT   hCG, quantitative, pregnancy   Collection Time: 01/08/23  7:14 PM  Result Value Ref Range   hCG, Beta Chain, Quant, S 61 (H) <5 mIU/mL   Imaging Studies: US OB Transvaginal  Addendum Date: 01/10/2023   ADDENDUM REPORT: 01/10/2023 22:10 ADDENDUM: Additional images were obtained. A was provided additional  history of early pregnancy termination in May 2024 with concern for retained product of conception. The endometrium is thickened measuring 14 mm in thickness. There is vascularity within the endometrium. Findings are concerning for retained product of conception.  Correlation with clinical exam and HCG levels recommended. These results were called by telephone at the time of interpretation on 01/10/2023 at 10:07 pm to nurse midwife Suzie Portela, who verbally acknowledged these results. Electronically Signed   By: Elgie Collard M.D.   On: 01/10/2023 22:10   Result Date: 01/10/2023 CLINICAL DATA:  Heavy vaginal bleeding. EXAM: TRANSVAGINAL OB ULTRASOUND TECHNIQUE: Transvaginal ultrasound was performed for complete evaluation of the gestation as well as the maternal uterus, adnexal regions, and pelvic cul-de-sac. COMPARISON:  Dec 08, 2022 FINDINGS: Intrauterine gestational sac: None Yolk sac:  Not Visualized. Embryo:  Not Visualized. Cardiac Activity: Not Visualized. Heart Rate: N/A bpm Subchorionic hemorrhage:  None visualized. Maternal uterus/adnexae: A 6.5 cm x 4.3 cm x 6.1 cm simple right ovarian cyst is seen. A 4.4 cm x 3.0 cm x 4.7 cm hemorrhagic left ovarian cyst is present. A 4.0 cm x 2.7 cm x 2.0 cm simple left ovarian cyst is also noted. There is a small amount pelvic free fluid. IMPRESSION: 1. No evidence of an intrauterine pregnancy. 2. Large, bilateral simple ovarian cysts. 3. Large hemorrhagic left ovarian cyst. Electronically Signed: By: Aram Candela M.D. On: 01/10/2023 20:20    Assessment: Principal Problem:   Retained products of conception following abortion Symptomatic anemia  Discussed recommendation for transfusion given Hgb 6.1 & symptoms, pt agreeable Reviewed options for management for suspected rPOCs, less likely abnormal early pregnancy (downtrending BhCG). Reviewed option for expectant or medical management, but that I would not favor these options given her significant anemia.  Recommended surgical management with D&E with option for bedside MVA or OR procedure. She is HDS and not acutely heavily bleeding so urgent intervention is not indicated. Pt prefers OR procedure so she can be asleep; counseled patient on limitations in OR time and this may delay how soon we can complete her procedure. She expressed her understanding  Plan: Admit to [facility] for observation & transfusion Transfuse 1u pRBCs NPO @ [facility], will see if we can add her on to OR tomorrow AM. If unable to add on to OR tomorrow, will schedule as an outpatient procedure.  Harvie Bridge, MD Obstetrician & Gynecologist, [facility] for [employer], [facility]

## 2023-01-10 NOTE — MAU Provider Note (Signed)
History     [id number]  Arrival date and time: 01/10/23 1731    Chief Complaint  Patient presents with   Follow-up     HPI Patricia Craig is a 27 y.o. at unknown gestational age with PMHx notable for hyperemesis gavidarum, hypothyroidism, who presents for planned re-evaluation.   Patient seen in MAU two days prior on 01/08/2023 At that time presented with vaginal bleeding in setting of TAB done on 11/28/2022 HCG was 61, unclear situation given history, [place] recommended but patient unable to stay so plan made to return in 48 hours for re-evaluation  Today patient reports she continues to bleed and is passing some clots Ongoing cramping, bilateral Does not feel like her period but has not had a normal period since her TAB in May Reports she has been sexually active since TAB but not totally consistent with using protection      OB History     Gravida  3   Para  1   Term  1   Preterm      AB  1   Living  1      SAB      IAB      Ectopic      Multiple  0   Live Births  1           Past Medical History:  Diagnosis Date   Anxiety    Herpes simplex type 2 infection     Past Surgical History:  Procedure Laterality Date   DILATION AND EVACUATION N/A 07/12/2021   Procedure: DILATATION AND EVACUATION;  Surgeon: Lazaro Arms, MD;  Location: [facility] OR;  Service: Gynecology;  Laterality: N/A;   MOUTH SURGERY      No family history on file.  Social History   Socioeconomic History   Marital status: Single    Spouse name: Not on file   Number of children: Not on file   Years of education: Not on file   Highest education level: Not on file  Occupational History   Not on file  Tobacco Use   Smoking status: Never   Smokeless tobacco: Never  Vaping Use   Vaping Use: Never used  Substance and Sexual Activity   Alcohol use: No   Drug use: Not Currently    Types: Marijuana    Comment: 2021   Sexual activity: Yes    Birth control/protection: None   Other Topics Concern   Not on file  Social History Narrative   Not on file   Social Determinants of Health   Financial Resource Strain: Not on file  Food Insecurity: No Food Insecurity (06/28/2021)   Hunger Vital Sign    Worried About Running Out of Food in the Last Year: Never true    Ran Out of Food in the Last Year: Never true  Transportation Needs: No Transportation Needs (06/28/2021)   PRAPARE - Administrator, Civil Service (Medical): No    Lack of Transportation (Non-Medical): No  Physical Activity: Not on file  Stress: Not on file  Social Connections: Not on file  Intimate Partner Violence: Not on file    No Known Allergies  No current facility-administered medications on file prior to encounter.   Current Outpatient Medications on File Prior to Encounter  Medication Sig Dispense Refill   metroNIDAZOLE (METROGEL) 0.75 % vaginal gel Place 1 Applicatorful vaginally at bedtime. Apply one applicatorful to vagina at bedtime for 5 days 70 g 1  ROS Pertinent positives and negative per HPI, all others reviewed and negative  Physical Exam   BP 109/66 (BP Location: Right Arm)   Pulse 88   Temp 98.6 F (37 C) (Oral)   Resp 16   Ht 5\' 2"  (1.575 m)   Wt 62.6 kg   SpO2 100%   BMI 25.24 kg/m   Patient Vitals for the past 24 hrs:  BP Temp Temp src Pulse Resp SpO2 Height Weight  01/10/23 1756 109/66 98.6 F (37 C) Oral 88 16 100 % 5\' 2"  (1.575 m) 62.6 kg    Physical Exam Vitals reviewed.  Constitutional:      General: She is not in acute distress.    Appearance: She is well-developed. She is not diaphoretic.  Eyes:     General: No scleral icterus. Pulmonary:     Effort: Pulmonary effort is normal. No respiratory distress.  Skin:    General: Skin is warm and dry.  Neurological:     Mental Status: She is alert.     Coordination: Coordination normal.      Cervical Exam    Bedside Ultrasound Not performed.  My interpretation:  n/a   Labs Results for orders placed or performed during the hospital encounter of 01/10/23 (from the past 24 hour(s))  hCG, quantitative, pregnancy     Status: Abnormal   Collection Time: 01/10/23  6:17 PM  Result Value Ref Range   hCG, Beta Chain, Quant, S 39 (H) <5 mIU/mL  CBC     Status: Abnormal   Collection Time: 01/10/23  6:17 PM  Result Value Ref Range   WBC 5.6 4.0 - 10.5 K/uL   RBC 2.84 (L) 3.87 - 5.11 MIL/uL   Hemoglobin 6.1 (LL) 12.0 - 15.0 g/dL   HCT 40.9 (L) 81.1 - 91.4 %   MCV 73.2 (L) 80.0 - 100.0 fL   MCH 21.5 (L) 26.0 - 34.0 pg   MCHC 29.3 (L) 30.0 - 36.0 g/dL   RDW 78.2 (H) 95.6 - 21.3 %   Platelets 305 150 - 400 K/uL   nRBC 0.0 0.0 - 0.2 %  Comprehensive metabolic panel     Status: Abnormal   Collection Time: 01/10/23  6:17 PM  Result Value Ref Range   Sodium 137 135 - 145 mmol/L   Potassium 3.7 3.5 - 5.1 mmol/L   Chloride 105 98 - 111 mmol/L   CO2 25 22 - 32 mmol/L   Glucose, Bld 94 70 - 99 mg/dL   BUN 9 6 - 20 mg/dL   Creatinine, Ser 0.86 0.44 - 1.00 mg/dL   Calcium 8.7 (L) 8.9 - 10.3 mg/dL   Total Protein 6.6 6.5 - 8.1 g/dL   Albumin 3.5 3.5 - 5.0 g/dL   AST 16 15 - 41 U/L   ALT 9 0 - 44 U/L   Alkaline Phosphatase 36 (L) 38 - 126 U/L   Total Bilirubin 1.0 0.3 - 1.2 mg/dL   GFR, Estimated >57 >84 mL/min   Anion gap 7 5 - 15    Imaging No results found.  MAU Course  Procedures Lab Orders         hCG, quantitative, pregnancy         CBC         Comprehensive metabolic panel    No orders of the defined types were placed in this encounter.  Imaging Orders         US OB Transvaginal     MDM Moderate (Level  3-4)  Assessment and Plan  #TAB vs new miscarriage #Acute blood loss anemia Patient's labs returned while she was in [place] suite, notable for downtrending hcg from 61>39 as well as unexpected finding of hgb 6.1 compared to 10 on last check two months prior. Due to way initial study was ordered no color doppler done of the endometrium  which does look mildly distended on my evaluation. Called and discussed with [place] tech who will discuss with radiology so that patient can be brought back for further evaluation. If retained products are present would likely be for admit with transfusion of 1u pRBC and D&C in AM.   Care signed out to Dorathy Daft CNM at change of shift pending results of repeat US evaluation.    Dispo: pending    Venora Maples, MD/MPH 01/10/23 8:26 PM

## 2023-01-11 ENCOUNTER — Observation Stay (HOSPITAL_COMMUNITY): Payer: Medicaid Other | Admitting: Anesthesiology

## 2023-01-11 ENCOUNTER — Observation Stay (HOSPITAL_BASED_OUTPATIENT_CLINIC_OR_DEPARTMENT_OTHER): Payer: Medicaid Other | Admitting: Anesthesiology

## 2023-01-11 ENCOUNTER — Encounter (HOSPITAL_COMMUNITY)
Admission: AD | Disposition: A | Discharge status: Home / Self Care | Payer: Self-pay | Source: Home / Self Care | Attending: Obstetrics and Gynecology

## 2023-01-11 ENCOUNTER — Encounter (HOSPITAL_COMMUNITY): Payer: Self-pay | Admitting: Obstetrics and Gynecology

## 2023-01-11 ENCOUNTER — Other Ambulatory Visit: Payer: Self-pay

## 2023-01-11 DIAGNOSIS — O034 Incomplete spontaneous abortion without complication: Secondary | ICD-10-CM | POA: Diagnosis not present

## 2023-01-11 DIAGNOSIS — Z3A13 13 weeks gestation of pregnancy: Secondary | ICD-10-CM | POA: Diagnosis not present

## 2023-01-11 DIAGNOSIS — Z3A10 10 weeks gestation of pregnancy: Secondary | ICD-10-CM | POA: Diagnosis not present

## 2023-01-11 HISTORY — PX: DILATION AND EVACUATION: SHX1459

## 2023-01-11 LAB — CBC
HCT: 23.1 % — ABNORMAL LOW (ref 36.0–46.0)
Hemoglobin: 7.2 g/dL — ABNORMAL LOW (ref 12.0–15.0)
MCH: 23.8 pg — ABNORMAL LOW (ref 26.0–34.0)
MCHC: 31.2 g/dL (ref 30.0–36.0)
MCV: 76.5 fL — ABNORMAL LOW (ref 80.0–100.0)
Platelets: 264 10*3/uL (ref 150–400)
RBC: 3.02 MIL/uL — ABNORMAL LOW (ref 3.87–5.11)
RDW: 17.8 % — ABNORMAL HIGH (ref 11.5–15.5)
WBC: 6.2 10*3/uL (ref 4.0–10.5)
nRBC: 0 % (ref 0.0–0.2)

## 2023-01-11 LAB — POCT I-STAT, CHEM 8
BUN: 7 mg/dL (ref 6–20)
Calcium, Ion: 1.14 mmol/L — ABNORMAL LOW (ref 1.15–1.40)
Chloride: 107 mmol/L (ref 98–111)
Creatinine, Ser: 0.7 mg/dL (ref 0.44–1.00)
Glucose, Bld: 78 mg/dL (ref 70–99)
HCT: 27 % — ABNORMAL LOW (ref 36.0–46.0)
Hemoglobin: 9.2 g/dL — ABNORMAL LOW (ref 12.0–15.0)
Potassium: 3.7 mmol/L (ref 3.5–5.1)
Sodium: 139 mmol/L (ref 135–145)
TCO2: 21 mmol/L — ABNORMAL LOW (ref 22–32)

## 2023-01-11 LAB — BPAM RBC
Blood Product Expiration Date: 202407242359
Unit Type and Rh: 5100

## 2023-01-11 LAB — TYPE AND SCREEN: Unit division: 0

## 2023-01-11 LAB — PREPARE RBC (CROSSMATCH)

## 2023-01-11 SURGERY — DILATION AND EVACUATION, UTERUS
Anesthesia: General | Site: Vagina

## 2023-01-11 MED ORDER — PHENYLEPHRINE 80 MCG/ML (10ML) SYRINGE FOR IV PUSH (FOR BLOOD PRESSURE SUPPORT)
PREFILLED_SYRINGE | INTRAVENOUS | Status: DC | PRN
  Administered 2023-01-11: 160 ug via INTRAVENOUS

## 2023-01-11 MED ORDER — DEXAMETHASONE SODIUM PHOSPHATE 10 MG/ML IJ SOLN
INTRAMUSCULAR | Status: DC | PRN
  Administered 2023-01-11: 10 mg via INTRAVENOUS

## 2023-01-11 MED ORDER — FENTANYL CITRATE (PF) 250 MCG/5ML IJ SOLN
INTRAMUSCULAR | Status: DC | PRN
  Administered 2023-01-11: 50 ug via INTRAVENOUS

## 2023-01-11 MED ORDER — PROMETHAZINE HCL 25 MG/ML IJ SOLN: 6.2500 mg | INTRAMUSCULAR | Status: DC | PRN

## 2023-01-11 MED ORDER — SIMETHICONE 80 MG PO CHEW: 80.0000 mg | CHEWABLE_TABLET | Freq: Four times a day (QID) | ORAL | Status: DC | PRN

## 2023-01-11 MED ORDER — LACTATED RINGERS IV SOLN: INTRAVENOUS | Status: DC

## 2023-01-11 MED ORDER — FENTANYL CITRATE (PF) 250 MCG/5ML IJ SOLN
INTRAMUSCULAR | Status: AC
  Filled 2023-01-11: qty 5

## 2023-01-11 MED ORDER — PHENYLEPHRINE 80 MCG/ML (10ML) SYRINGE FOR IV PUSH (FOR BLOOD PRESSURE SUPPORT)
PREFILLED_SYRINGE | INTRAVENOUS | Status: AC
  Filled 2023-01-11: qty 10

## 2023-01-11 MED ORDER — LIDOCAINE-EPINEPHRINE 1 %-1:100000 IJ SOLN
INTRAMUSCULAR | Status: DC | PRN
  Administered 2023-01-11: 20 mL

## 2023-01-11 MED ORDER — CHLORHEXIDINE GLUCONATE 0.12 % MT SOLN: 15.0000 mL | Freq: Once | OROMUCOSAL | Status: DC

## 2023-01-11 MED ORDER — LIDOCAINE 2% (20 MG/ML) 5 ML SYRINGE
INTRAMUSCULAR | Status: DC | PRN
  Administered 2023-01-11: 40 mg via INTRAVENOUS

## 2023-01-11 MED ORDER — MIDAZOLAM HCL 2 MG/2ML IJ SOLN
INTRAMUSCULAR | Status: AC
  Filled 2023-01-11: qty 2

## 2023-01-11 MED ORDER — ALUM & MAG HYDROXIDE-SIMETH 200-200-20 MG/5ML PO SUSP: 30.0000 mL | ORAL | Status: DC | PRN

## 2023-01-11 MED ORDER — CELECOXIB 200 MG PO CAPS
200.0000 mg | ORAL_CAPSULE | Freq: Once | ORAL | Status: AC
  Administered 2023-01-11: 200 mg via ORAL
  Filled 2023-01-11: qty 1

## 2023-01-11 MED ORDER — SODIUM CHLORIDE 0.9 % IV SOLN
INTRAVENOUS | Status: DC | PRN
  Administered 2023-01-11: 100 mg via INTRAVENOUS

## 2023-01-11 MED ORDER — OXYCODONE HCL 5 MG/5ML PO SOLN: 5.0000 mg | Freq: Once | ORAL | Status: DC | PRN

## 2023-01-11 MED ORDER — MIDAZOLAM HCL 2 MG/2ML IJ SOLN
INTRAMUSCULAR | Status: DC | PRN
  Administered 2023-01-11: 2 mg via INTRAVENOUS

## 2023-01-11 MED ORDER — IBUPROFEN 600 MG PO TABS: 600.0000 mg | ORAL_TABLET | Freq: Four times a day (QID) | ORAL | Status: DC | PRN

## 2023-01-11 MED ORDER — CHLORHEXIDINE GLUCONATE 0.12 % MT SOLN
OROMUCOSAL | Status: AC
  Filled 2023-01-11: qty 15

## 2023-01-11 MED ORDER — ONDANSETRON HCL 4 MG/2ML IJ SOLN: 4.0000 mg | Freq: Four times a day (QID) | INTRAMUSCULAR | Status: DC | PRN

## 2023-01-11 MED ORDER — SODIUM CHLORIDE 0.9% FLUSH
3.0000 mL | Freq: Two times a day (BID) | INTRAVENOUS | Status: DC
  Administered 2023-01-11: 3 mL via INTRAVENOUS

## 2023-01-11 MED ORDER — LIDOCAINE 2% (20 MG/ML) 5 ML SYRINGE
INTRAMUSCULAR | Status: AC
  Filled 2023-01-11: qty 5

## 2023-01-11 MED ORDER — ACETAMINOPHEN 500 MG PO TABS
1000.0000 mg | ORAL_TABLET | Freq: Once | ORAL | Status: AC
  Administered 2023-01-11: 1000 mg via ORAL
  Filled 2023-01-11: qty 2

## 2023-01-11 MED ORDER — LIDOCAINE-EPINEPHRINE 1 %-1:100000 IJ SOLN
INTRAMUSCULAR | Status: AC
  Filled 2023-01-11: qty 1

## 2023-01-11 MED ORDER — ONDANSETRON HCL 4 MG/2ML IJ SOLN
INTRAMUSCULAR | Status: AC
  Filled 2023-01-11: qty 2

## 2023-01-11 MED ORDER — SODIUM CHLORIDE 0.9% FLUSH: 3.0000 mL | INTRAVENOUS | Status: DC | PRN

## 2023-01-11 MED ORDER — MELOXICAM 15 MG PO TABS: 15.0000 mg | ORAL_TABLET | Freq: Every day | ORAL | 0 refills | Status: DC

## 2023-01-11 MED ORDER — SODIUM CHLORIDE 0.9 % IV SOLN
100.0000 mg | Freq: Once | INTRAVENOUS | Status: DC
  Filled 2023-01-11: qty 100

## 2023-01-11 MED ORDER — ONDANSETRON HCL 4 MG/2ML IJ SOLN
INTRAMUSCULAR | Status: DC | PRN
  Administered 2023-01-11: 4 mg via INTRAVENOUS

## 2023-01-11 MED ORDER — SODIUM CHLORIDE 0.9% IV SOLUTION: Freq: Once | INTRAVENOUS | Status: AC

## 2023-01-11 MED ORDER — FENTANYL CITRATE (PF) 100 MCG/2ML IJ SOLN: 25.0000 ug | INTRAMUSCULAR | Status: DC | PRN

## 2023-01-11 MED ORDER — PROPOFOL 10 MG/ML IV BOLUS
INTRAVENOUS | Status: DC | PRN
  Administered 2023-01-11: 140 mg via INTRAVENOUS
  Administered 2023-01-11: 60 mg via INTRAVENOUS

## 2023-01-11 MED ORDER — DEXAMETHASONE SODIUM PHOSPHATE 10 MG/ML IJ SOLN
INTRAMUSCULAR | Status: AC
  Filled 2023-01-11: qty 1

## 2023-01-11 MED ORDER — OXYCODONE HCL 5 MG PO TABS: 5.0000 mg | ORAL_TABLET | Freq: Once | ORAL | Status: DC | PRN

## 2023-01-11 MED ORDER — 0.9 % SODIUM CHLORIDE (POUR BTL) OPTIME
TOPICAL | Status: DC | PRN
  Administered 2023-01-11: 1000 mL

## 2023-01-11 MED ORDER — SODIUM CHLORIDE 0.9 % IV SOLN: 250.0000 mL | INTRAVENOUS | Status: DC | PRN

## 2023-01-11 MED ORDER — SENNOSIDES-DOCUSATE SODIUM 8.6-50 MG PO TABS: 1.0000 | ORAL_TABLET | Freq: Every evening | ORAL | Status: DC | PRN

## 2023-01-11 MED ORDER — ONDANSETRON HCL 4 MG PO TABS: 4.0000 mg | ORAL_TABLET | Freq: Four times a day (QID) | ORAL | Status: DC | PRN

## 2023-01-11 SURGICAL SUPPLY — 20 items
CATH ROBINSON RED A/P 16FR (CATHETERS) IMPLANT
FILTER UTR ASPR ASSEMBLY (MISCELLANEOUS) ×1 IMPLANT
GLOVE SURG ORTHO 8.0 STRL STRW (GLOVE) ×1 IMPLANT
GOWN STRL REUS W/ TWL LRG LVL3 (GOWN DISPOSABLE) ×1 IMPLANT
GOWN STRL REUS W/ TWL XL LVL3 (GOWN DISPOSABLE) ×1 IMPLANT
GOWN STRL REUS W/TWL LRG LVL3 (GOWN DISPOSABLE) ×1
GOWN STRL REUS W/TWL XL LVL3 (GOWN DISPOSABLE) ×1
HOSE CONNECTING 18IN BERKELEY (TUBING) ×1 IMPLANT
KIT BERKELEY 1ST TRI 3/8 NO TR (MISCELLANEOUS) ×1 IMPLANT
KIT BERKELEY 1ST TRIMESTER 3/8 (MISCELLANEOUS) ×1 IMPLANT
NS IRRIG 1000ML POUR BTL (IV SOLUTION) ×1 IMPLANT
PACK VAGINAL MINOR WOMEN LF (CUSTOM PROCEDURE TRAY) ×1 IMPLANT
PAD OB MATERNITY 4.3X12.25 (PERSONAL CARE ITEMS) ×1 IMPLANT
SET BERKELEY SUCTION TUBING (SUCTIONS) ×1 IMPLANT
TOWEL GREEN STERILE FF (TOWEL DISPOSABLE) ×1 IMPLANT
UNDERPAD 30X36 HEAVY ABSORB (UNDERPADS AND DIAPERS) ×1 IMPLANT
VACURETTE 10 RIGID CVD (CANNULA) IMPLANT
VACURETTE 7MM CVD STRL WRAP (CANNULA) IMPLANT
VACURETTE 8 RIGID CVD (CANNULA) IMPLANT
VACURETTE 9 RIGID CVD (CANNULA) IMPLANT

## 2023-01-11 NOTE — Anesthesia Preprocedure Evaluation (Addendum)
Anesthesia Evaluation  Patient identified by MRN, date of birth, ID band Patient awake    Reviewed: Allergy & Precautions, NPO status , Patient's Chart, lab work & pertinent test results  History of Anesthesia Complications Negative for: history of anesthetic complications  Airway Mallampati: I  TM Distance: >3 FB     Dental  (+) Dental Advisory Given   Pulmonary neg pulmonary ROS   breath sounds clear to auscultation       Cardiovascular negative cardio ROS  Rhythm:Regular Rate:Normal     Neuro/Psych   Anxiety     negative neurological ROS  negative psych ROS   GI/Hepatic negative GI ROS, Neg liver ROS,,,  Endo/Other  negative endocrine ROS    Renal/GU negative Renal ROS     Musculoskeletal negative musculoskeletal ROS (+)    Abdominal   Peds  Hematology  (+) Blood dyscrasia (Hb 7.2 s/p transfusion 1u prbc), anemia   Anesthesia Other Findings   Reproductive/Obstetrics Retained products s/p SAB                              Anesthesia Physical Anesthesia Plan  ASA: 1  Anesthesia Plan: General   Post-op Pain Management: Tylenol PO (pre-op)* and Celebrex PO (pre-op)*   Induction: Intravenous  PONV Risk Score and Plan: 3 and Treatment may vary due to age or medical condition, Ondansetron, Dexamethasone and Midazolam  Airway Management Planned: LMA  Additional Equipment: None  Intra-op Plan:   Post-operative Plan: Extubation in OR  Informed Consent: I have reviewed the patients History and Physical, chart, labs and discussed the procedure including the risks, benefits and alternatives for the proposed anesthesia with the patient or authorized representative who has indicated his/her understanding and acceptance.     Dental advisory given  Plan Discussed with: CRNA and Surgeon  Anesthesia Plan Comments:          Anesthesia Quick Evaluation

## 2023-01-11 NOTE — Progress Notes (Signed)
FACULTY PRACTICE ANTEPARTUM COMPREHENSIVE PROGRESS NOTE  Patricia Craig is a 27 y.o. [id number] who is admitted for rPOCS and symptomatic anemia.  Estimated Date of Delivery: 07/16/23  Length of Stay:  0 Days. Admitted 01/10/2023  Subjective: Bleeding is slowing down. Was able to get some rest last night. No pain, fevers, or chills.   Vitals:  Blood pressure (!) 99/51, pulse (!) 56, temperature 98.5 F (36.9 C), temperature source Oral, resp. rate 18, height 5\' 2"  (1.575 m), weight 62.6 kg, SpO2 100 %. Physical Examination: CONSTITUTIONAL: Well-developed, well-nourished female in no acute distress.  CARDIOVASCULAR: Normal heart rate noted RESPIRATORY: Effort normal, no problems with respiration noted ABDOMEN: Soft, nontender, nondistended  Results for orders placed or performed during the hospital encounter of 01/10/23 (from the past 48 hour(s))  hCG, quantitative, pregnancy     Status: Abnormal   Collection Time: 01/10/23  6:17 PM  Result Value Ref Range   hCG, Beta Chain, Quant, S 39 (H) <5 mIU/mL    Comment:          GEST. AGE      CONC.  (mIU/mL)   <=1 WEEK        5 - 50     2 WEEKS       50 - 500     3 WEEKS       100 - 10,000     4 WEEKS     1,000 - 30,000     5 WEEKS     3,500 - 115,000   6-8 WEEKS     12,000 - 270,000    12 WEEKS     15,000 - 220,000        FEMALE AND NON-PREGNANT FEMALE:     LESS THAN 5 mIU/mL Performed at [facility], [address] [place], [place] [address]   CBC     Status: Abnormal   Collection Time: 01/10/23  6:17 PM  Result Value Ref Range   WBC 5.6 4.0 - 10.5 K/uL   RBC 2.84 (L) 3.87 - 5.11 MIL/uL   Hemoglobin 6.1 (LL) 12.0 - 15.0 g/dL    Comment: REPEATED TO VERIFY Reticulocyte Hemoglobin testing may be clinically indicated, consider ordering this additional test [id number] THIS CRITICAL RESULT HAS VERIFIED AND BEEN CALLED TO K SHROPSHIRE,RN BY WALTER BOND ON 06 26 2024 AT 1951, AND HAS BEEN READ BACK.     HCT 20.8 (L) 36.0 - 46.0  %   MCV 73.2 (L) 80.0 - 100.0 fL   MCH 21.5 (L) 26.0 - 34.0 pg   MCHC 29.3 (L) 30.0 - 36.0 g/dL   RDW 38.7 (H) 56.4 - 33.2 %   Platelets 305 150 - 400 K/uL   nRBC 0.0 0.0 - 0.2 %    Comment: Performed at [facility], [address], [place], [place] [address]  Comprehensive metabolic panel     Status: Abnormal   Collection Time: 01/10/23  6:17 PM  Result Value Ref Range   Sodium 137 135 - 145 mmol/L   Potassium 3.7 3.5 - 5.1 mmol/L   Chloride 105 98 - 111 mmol/L   CO2 25 22 - 32 mmol/L   Glucose, Bld 94 70 - 99 mg/dL    Comment: Glucose reference range applies only to samples taken after fasting for at least 8 hours.   BUN 9 6 - 20 mg/dL   Creatinine, Ser 4.16 0.44 - 1.00 mg/dL   Calcium 8.7 (L) 8.9 - 10.3  mg/dL   Total Protein 6.6 6.5 - 8.1 g/dL   Albumin 3.5 3.5 - 5.0 g/dL   AST 16 15 - 41 U/L   ALT 9 0 - 44 U/L   Alkaline Phosphatase 36 (L) 38 - 126 U/L   Total Bilirubin 1.0 0.3 - 1.2 mg/dL   GFR, Estimated >84 >69 mL/min    Comment: (NOTE) Calculated using the CKD-EPI Creatinine Equation (2021)    Anion gap 7 5 - 15    Comment: Performed at [facility], [address], [place], [place] [address]  Type and screen [facility]     Status: None (Preliminary result)   Collection Time: 01/10/23 11:27 PM  Result Value Ref Range   ABO/RH(D) O POS    Antibody Screen NEG    Sample Expiration 01/13/2023,2359    Unit Number [id number]    Blood Component Type RED CELLS,LR    Unit division 00    Status of Unit ISSUED    Transfusion Status OK TO TRANSFUSE    Crossmatch Result      Compatible Performed at [facility], [address], [place], [place] [address]   Prepare RBC (crossmatch)     Status: None   Collection Time: 01/11/23 12:30 AM  Result Value Ref Range   Order Confirmation      ORDER PROCESSED BY BLOOD BANK Performed at [facility], [address], [place], [place] [address]   CBC     Status: Abnormal   Collection  Time: 01/11/23  6:25 AM  Result Value Ref Range   WBC 6.2 4.0 - 10.5 K/uL   RBC 3.02 (L) 3.87 - 5.11 MIL/uL   Hemoglobin 7.2 (L) 12.0 - 15.0 g/dL    Comment: Reticulocyte Hemoglobin testing may be clinically indicated, consider ordering this additional test [id number]    HCT 23.1 (L) 36.0 - 46.0 %   MCV 76.5 (L) 80.0 - 100.0 fL   MCH 23.8 (L) 26.0 - 34.0 pg   MCHC 31.2 30.0 - 36.0 g/dL   RDW 75.6 (H) 43.3 - 29.5 %   Platelets 264 150 - 400 K/uL   nRBC 0.0 0.0 - 0.2 %    Comment: Performed at [facility], [address], [place], [place] [address]    [place] Transvaginal  Addendum Date: 01/10/2023   ADDENDUM REPORT: 01/10/2023 22:10 ADDENDUM: Additional images were obtained. A was provided additional history of early pregnancy termination in May 2024 with concern for retained product of conception. The endometrium is thickened measuring 14 mm in thickness. There is vascularity within the endometrium. Findings are concerning for retained product of conception. Correlation with clinical exam and HCG levels recommended. These results were called by telephone at the time of interpretation on 01/10/2023 at 10:07 pm to nurse midwife Suzie Portela, who verbally acknowledged these results. Electronically Signed   By: Elgie Collard M.D.   On: 01/10/2023 22:10   Result Date: 01/10/2023 CLINICAL DATA:  Heavy vaginal bleeding. EXAM: TRANSVAGINAL OB ULTRASOUND TECHNIQUE: Transvaginal ultrasound was performed for complete evaluation of the gestation as well as the maternal uterus, adnexal regions, and pelvic cul-de-sac. COMPARISON:  Dec 08, 2022 FINDINGS: Intrauterine gestational sac: None Yolk sac:  Not Visualized. Embryo:  Not Visualized. Cardiac Activity: Not Visualized. Heart Rate: N/A bpm Subchorionic hemorrhage:  None visualized. Maternal uterus/adnexae: A 6.5 cm x 4.3 cm x 6.1 cm simple right ovarian cyst is seen. A 4.4 cm x 3.0 cm x 4.7  cm hemorrhagic left ovarian cyst is present. A 4.0 cm x 2.7 cm x  2.0 cm simple left ovarian cyst is also noted. There is a small amount pelvic free fluid. IMPRESSION: 1. No evidence of an intrauterine pregnancy. 2. Large, bilateral simple ovarian cysts. 3. Large hemorrhagic left ovarian cyst. Electronically Signed: By: Aram Candela M.D. On: 01/10/2023 20:20    Current scheduled medications  sodium chloride flush  3 mL Intravenous Q12H    I have reviewed the patient's current medications.  ASSESSMENT: Principal Problem:   Retained products of conception following abortion S/p 1u pRBCs, Hgb with appropriate rise this AM  PLAN: - Working to coordinate OR time for suction D&C for rPOCs. Potentially today vs tomorrow afternoon. - Reviewed risks of suction D&C include but are not limited to pain, infection, bleeding, injury to uterus with uterine perforation or adhesion formation, injury to bowel/bladder/blood vessels, need for additional procedures, anesthesia reaction. Pt agreeable to plan to proceed with suction D&C.  Dispo: pending OR time  Harvie Bridge, MD Obstetrician & Gynecologist, [facility] for [employer], [facility]

## 2023-01-11 NOTE — Discharge Instructions (Signed)
Per Dr. Shawnie Pons patient is excused from work on 01/11/23 and 01/12/23.

## 2023-01-11 NOTE — Transfer of Care (Signed)
Immediate Anesthesia Transfer of Care Note  Patient: Patricia Craig  Procedure(s) Performed: DILATATION AND EVACUATION WITH SUCTION  Patient Location: PACU  Anesthesia Type:General  Level of Consciousness: drowsy and patient cooperative  Airway & Oxygen Therapy: Patient connected to face mask oxygen  Post-op Assessment: Report given to RN and Post -op Vital signs reviewed and stable  Post vital signs: Reviewed and stable  Last Vitals:  Vitals Value Taken Time  BP 116/57 01/11/23 1704  Temp    Pulse 93 01/11/23 1705  Resp    SpO2 100 % 01/11/23 1705  Vitals shown include unvalidated device data.  Last Pain:  Vitals:   01/11/23 1048  TempSrc:   PainSc: 0-No pain         Complications: There were no known notable events for this encounter.

## 2023-01-11 NOTE — Anesthesia Procedure Notes (Addendum)
Procedure Name: LMA Insertion Date/Time: 01/11/2023 4:26 PM  Performed by: Sharyn Dross, CRNAPre-anesthesia Checklist: Patient identified, Emergency Drugs available, Suction available and Patient being monitored Patient Re-evaluated:Patient Re-evaluated prior to induction Oxygen Delivery Method: Circle system utilized Preoxygenation: Pre-oxygenation with 100% oxygen Induction Type: IV induction Ventilation: Mask ventilation without difficulty LMA: LMA inserted LMA Size: 4.0 Number of attempts: 1 Placement Confirmation: positive ETCO2 and breath sounds checked- equal and bilateral Tube secured with: Tape Dental Injury: Teeth and Oropharynx as per pre-operative assessment

## 2023-01-11 NOTE — Op Note (Signed)
Patricia Craig  PROCEDURE DATE: 01/10/2023 - 01/11/2023  PREOPERATIVE DIAGNOSIS: retained POC  POSTOPERATIVE DIAGNOSIS: The same.  PROCEDURE:    Suction Dilation and Evacuation.  SURGEON:  Reva Bores  ANESTHESIA: Val Eagle, MD  INDICATIONS: 27 y.o. G3P1021with TAB with retained POC, hemorrhage requiring blood transfusion, needing surgical completion.  Risks of surgery were discussed with the patient including but not limited to: bleeding which may require transfusion; infection which may require antibiotics; injury to uterus or surrounding organs;need for additional procedures including laparotomy or laparoscopy; possibility of intrauterine scarring which may impair future fertility; and other postoperative/anesthesia complications. Written informed consent was obtained.    FINDINGS:  A 10 week size midline uterus, small amounts of products of conception, specimen sent to pathology.  ANESTHESIA:    Monitored intravenous sedation, paracervical block.  ESTIMATED BLOOD LOSS:  Less than 20 ml.  SPECIMENS:  Products of conception sent to pathology  COMPLICATIONS:  None immediate.  PROCEDURE DETAILS:  The patient received intravenous antibiotics while in the preoperative area.  She was then taken to the operating room where general anesthesia was administered and was found to be adequate.  After an adequate timeout was performed, she was placed in the dorsal lithotomy position and examined; then prepped and draped in the sterile manner.   Her bladder was catheterized for an unmeasured amount of clear, yellow urine. A vaginal speculum was then placed in the patient's vagina and a single tooth tenaculum was applied to the anterior lip of the cervix.  A paracervical block using 1% Lidocaine with Epinephrine was administered. The cervix was gently dilated to accommodate a 10 mm suction curette that was gently advanced to the uterine fundus.  The suction device was then activated and  curette slowly rotated to clear the uterus of products of conception.  A sharp curettage was then performed to confirm complete emptying of the uterus.There was minimal bleeding noted and the tenaculum removed with good hemostasis noted.  All insturmnent, needle and lap counts were correct x 2.The patient tolerated the procedure well.  The patient was taken to the recovery area in stable condition.  Reva Bores 01/11/2023 4:48 PM

## 2023-01-11 NOTE — Interval H&P Note (Signed)
History and Physical Interval Note:  01/11/2023 3:18 PM  Patricia Craig  has presented today for surgery, with the diagnosis of retained products.  The various methods of treatment have been discussed with the patient and family. After consideration of risks, benefits and other options for treatment, the patient has consented to  Procedure(s): DILATATION AND EVACUATION WITH SUCTION (N/A) as a surgical intervention. She is s/p 1 u PRBCs and stable hgb. 2nd IV started. The patient's history has been reviewed, patient examined, no change in status, stable for surgery.  I have reviewed the patient's chart and labs.  Questions were answered to the patient's satisfaction.     Reva Bores

## 2023-01-12 ENCOUNTER — Encounter (HOSPITAL_COMMUNITY): Payer: Self-pay | Admitting: Family Medicine

## 2023-01-12 LAB — TYPE AND SCREEN
ABO/RH(D): O POS
Antibody Screen: NEGATIVE

## 2023-01-12 LAB — BPAM RBC: ISSUE DATE / TIME: 202406270037

## 2023-01-12 NOTE — Discharge Summary (Signed)
Physician Discharge Summary  Patient ID: Patricia Craig MRN: [id number] DOB/AGE: 11-08-1995 27 y.o.  Admit date: 01/10/2023 Discharge date: 01/11/2023  6:08 PM    Admission Diagnoses:  Principal Problem:   Retained products of conception following abortion  Acute blood loss anemia  Discharge Diagnoses:  Same  Past Medical History:  Diagnosis Date   Anxiety    Herpes simplex type 2 infection     Surgeries: Procedure(s): DILATATION AND EVACUATION WITH SUCTION on 01/11/2023   Consultants: None  Discharged Condition: Improved  Hospital Course: Patricia Craig is an 27 y.o. female [id number] who was admitted 01/10/2023 with a chief complaint of f/u HCG with profound anemia and falling HCGs, and found to have a diagnosis of Retained products of conception following abortion.  They received 1 u PRBCs and then were brought to the operating room on 01/11/2023 and underwent the above named procedures.    She was given perioperative antibiotics:  Anti-infectives (From admission, onward)    Start     Dose/Rate Route Frequency Ordered Stop   01/11/23 1530  doxycycline (VIBRAMYCIN) 100 mg in sodium chloride 0.9 % 250 mL IVPB        100 mg 125 mL/hr over 120 Minutes Intravenous  Once 01/11/23 1512       .  She benefited maximally from their hospital stay and there were no complications. She was ambulating, voiding, tolerating po and deemed stable for discharge.  Recent vital signs:  Vitals:   01/11/23 1730 01/11/23 1745  BP: 117/68 119/65  Pulse: (!) 59 61  Resp: 15 16  Temp:  (!) 97.2 F (36.2 C)  SpO2: 100% 100%   Discharge exam: Physical Examination: General appearance - alert, well appearing, and in no distress Chest - normal effort Heart - normal rate and regular rhythm Abdomen - soft, appropriately tender, dressing is clean and dry Extremities - peripheral pulses normal, no pedal edema, no clubbing or cyanosis, Homan's sign negative bilaterally Recent laboratory  studies:  Results for orders placed or performed during the hospital encounter of 01/10/23  hCG, quantitative, pregnancy  Result Value Ref Range   hCG, Beta Chain, Quant, S 39 (H) <5 mIU/mL  CBC  Result Value Ref Range   WBC 5.6 4.0 - 10.5 K/uL   RBC 2.84 (L) 3.87 - 5.11 MIL/uL   Hemoglobin 6.1 (LL) 12.0 - 15.0 g/dL   HCT 14.7 (L) 82.9 - 56.2 %   MCV 73.2 (L) 80.0 - 100.0 fL   MCH 21.5 (L) 26.0 - 34.0 pg   MCHC 29.3 (L) 30.0 - 36.0 g/dL   RDW 13.0 (H) 86.5 - 78.4 %   Platelets 305 150 - 400 K/uL   nRBC 0.0 0.0 - 0.2 %  Comprehensive metabolic panel  Result Value Ref Range   Sodium 137 135 - 145 mmol/L   Potassium 3.7 3.5 - 5.1 mmol/L   Chloride 105 98 - 111 mmol/L   CO2 25 22 - 32 mmol/L   Glucose, Bld 94 70 - 99 mg/dL   BUN 9 6 - 20 mg/dL   Creatinine, Ser 6.96 0.44 - 1.00 mg/dL   Calcium 8.7 (L) 8.9 - 10.3 mg/dL   Total Protein 6.6 6.5 - 8.1 g/dL   Albumin 3.5 3.5 - 5.0 g/dL   AST 16 15 - 41 U/L   ALT 9 0 - 44 U/L   Alkaline Phosphatase 36 (L) 38 - 126 U/L   Total Bilirubin 1.0 0.3 - 1.2 mg/dL   GFR, Estimated >  60 >60 mL/min   Anion gap 7 5 - 15  CBC  Result Value Ref Range   WBC 6.2 4.0 - 10.5 K/uL   RBC 3.02 (L) 3.87 - 5.11 MIL/uL   Hemoglobin 7.2 (L) 12.0 - 15.0 g/dL   HCT 16.1 (L) 09.6 - 04.5 %   MCV 76.5 (L) 80.0 - 100.0 fL   MCH 23.8 (L) 26.0 - 34.0 pg   MCHC 31.2 30.0 - 36.0 g/dL   RDW 40.9 (H) 81.1 - 91.4 %   Platelets 264 150 - 400 K/uL   nRBC 0.0 0.0 - 0.2 %  I-STAT, chem 8  Result Value Ref Range   Sodium 139 135 - 145 mmol/L   Potassium 3.7 3.5 - 5.1 mmol/L   Chloride 107 98 - 111 mmol/L   BUN 7 6 - 20 mg/dL   Creatinine, Ser 7.82 0.44 - 1.00 mg/dL   Glucose, Bld 78 70 - 99 mg/dL   Calcium, Ion 9.56 (L) 1.15 - 1.40 mmol/L   TCO2 21 (L) 22 - 32 mmol/L   Hemoglobin 9.2 (L) 12.0 - 15.0 g/dL   HCT 21.3 (L) 08.6 - 57.8 %  Type and screen [facility]  Result Value Ref Range   ABO/RH(D) O POS    Antibody Screen NEG    Sample  Expiration 01/13/2023,2359    Unit Number [id number]    Blood Component Type RED CELLS,LR    Unit division 00    Status of Unit ISSUED    Transfusion Status OK TO TRANSFUSE    Crossmatch Result      Compatible Performed at [facility], [address] [place], [place] [address]   Prepare RBC (crossmatch)  Result Value Ref Range   Order Confirmation      ORDER PROCESSED BY BLOOD BANK Performed at [facility], [address], [place], [place] [address]   [facility]  Result Value Ref Range   ISSUE DATE / TIME [id number]    Blood Product Unit Number [id number]    PRODUCT CODE [id number]    Unit Type and Rh 5100    Blood Product Expiration Date 188416606301     Discharge Medications:   Allergies as of 01/11/2023   No Known Allergies      Medication List     TAKE these medications    meloxicam 15 MG tablet Commonly known as: Mobic Take 1 tablet (15 mg total) by mouth daily.        Diagnostic Studies: US OB Transvaginal  Addendum Date: 01/10/2023   ADDENDUM REPORT: 01/10/2023 22:10 ADDENDUM: Additional images were obtained. A was provided additional history of early pregnancy termination in May 2024 with concern for retained product of conception. The endometrium is thickened measuring 14 mm in thickness. There is vascularity within the endometrium. Findings are concerning for retained product of conception. Correlation with clinical exam and HCG levels recommended. These results were called by telephone at the time of interpretation on 01/10/2023 at 10:07 pm to nurse midwife Suzie Portela, who verbally acknowledged these results. Electronically Signed   By: Elgie Collard M.D.   On: 01/10/2023 22:10   Result Date: 01/10/2023 CLINICAL DATA:  Heavy vaginal bleeding. EXAM: TRANSVAGINAL OB ULTRASOUND TECHNIQUE: Transvaginal ultrasound was performed for complete evaluation of the gestation as well as the maternal uterus, adnexal regions, and pelvic cul-de-sac.  COMPARISON:  Dec 08, 2022 FINDINGS: Intrauterine gestational sac: None Yolk sac:  Not Visualized. Embryo:  Not Visualized. Cardiac Activity: Not  Visualized. Heart Rate: N/A bpm Subchorionic hemorrhage:  None visualized. Maternal uterus/adnexae: A 6.5 cm x 4.3 cm x 6.1 cm simple right ovarian cyst is seen. A 4.4 cm x 3.0 cm x 4.7 cm hemorrhagic left ovarian cyst is present. A 4.0 cm x 2.7 cm x 2.0 cm simple left ovarian cyst is also noted. There is a small amount pelvic free fluid. IMPRESSION: 1. No evidence of an intrauterine pregnancy. 2. Large, bilateral simple ovarian cysts. 3. Large hemorrhagic left ovarian cyst. Electronically Signed: By: Aram Candela M.D. On: 01/10/2023 20:20    Disposition: Discharge disposition: 01-Home or Self Care       Discharge Instructions     Call MD for:  persistant nausea and vomiting   Complete by: As directed    Call MD for:  redness, tenderness, or signs of infection (pain, swelling, redness, odor or green/yellow discharge around incision site)   Complete by: As directed    Call MD for:  severe uncontrolled pain   Complete by: As directed    Call MD for:  temperature >100.4   Complete by: As directed    Diet - low sodium heart healthy   Complete by: As directed    Increase activity slowly   Complete by: As directed    Sexual Activity Restrictions   Complete by: As directed    None x 2 weeks        Follow-up Information     [facility] Follow up in 2 week(s).   Specialty: Obstetrics and Gynecology Why: postop check, they will call you with an appointment Contact information: [address] [place] [address] [phone]                 Signed: Reva Bores 01/12/2023, 7:53 AM

## 2023-01-15 LAB — SURGICAL PATHOLOGY

## 2023-01-16 NOTE — Anesthesia Postprocedure Evaluation (Signed)
Anesthesia Post Note  Patient: Patricia Craig  Procedure(s) Performed: DILATATION AND EVACUATION WITH SUCTION (Vagina )     Patient location during evaluation: PACU Anesthesia Type: General Level of consciousness: awake and alert Pain management: pain level controlled Vital Signs Assessment: post-procedure vital signs reviewed and stable Respiratory status: spontaneous breathing, nonlabored ventilation, respiratory function stable and patient connected to nasal cannula oxygen Cardiovascular status: blood pressure returned to baseline and stable Postop Assessment: no apparent nausea or vomiting Anesthetic complications: no   There were no known notable events for this encounter.  Last Vitals:  Vitals:   01/11/23 1730 01/11/23 1745  BP: 117/68 119/65  Pulse: (!) 59 61  Resp: 15 16  Temp:  (!) 36.2 C  SpO2: 100% 100%    Last Pain:  Vitals:   01/11/23 1730  TempSrc:   PainSc: 0-No pain                 Annessa Satre

## 2023-02-15 ENCOUNTER — Telehealth: Payer: Medicaid Other | Admitting: Family Medicine

## 2023-04-18 ENCOUNTER — Ambulatory Visit: Payer: Medicaid Other | Admitting: Obstetrics and Gynecology

## 2023-04-23 ENCOUNTER — Ambulatory Visit (INDEPENDENT_AMBULATORY_CARE_PROVIDER_SITE_OTHER): Payer: Medicaid Other | Admitting: Obstetrics and Gynecology

## 2023-04-23 ENCOUNTER — Other Ambulatory Visit (HOSPITAL_COMMUNITY)
Admission: RE | Admit: 2023-04-23 | Discharge: 2023-04-23 | Disposition: A | Discharge status: Home / Self Care | Payer: Medicaid Other | Source: Ambulatory Visit | Attending: Obstetrics and Gynecology | Admitting: Obstetrics and Gynecology

## 2023-04-23 ENCOUNTER — Encounter: Payer: Self-pay | Admitting: Obstetrics and Gynecology

## 2023-04-23 VITALS — BP 115/78 | HR 67 | Wt 141.6 lb

## 2023-04-23 DIAGNOSIS — Z113 Encounter for screening for infections with a predominantly sexual mode of transmission: Secondary | ICD-10-CM | POA: Insufficient documentation

## 2023-04-23 DIAGNOSIS — Z1330 Encounter for screening examination for mental health and behavioral disorders, unspecified: Secondary | ICD-10-CM | POA: Diagnosis not present

## 2023-04-23 DIAGNOSIS — Z0000 Encounter for general adult medical examination without abnormal findings: Secondary | ICD-10-CM

## 2023-04-23 DIAGNOSIS — N898 Other specified noninflammatory disorders of vagina: Secondary | ICD-10-CM | POA: Diagnosis not present

## 2023-04-23 DIAGNOSIS — Z124 Encounter for screening for malignant neoplasm of cervix: Secondary | ICD-10-CM

## 2023-04-23 NOTE — Progress Notes (Unsigned)
ANNUAL EXAM Patient name: Patricia Craig MRN [id number]  Date of birth: [birth year] Chief Complaint:   No chief complaint on file.  History of Present Illness:   Patricia Craig is a 27 y.o. [phone] being seen today for a routine annual exam.  Current complaints: re-occurring odor.   Menstrual concerns? {yes/no:20286}  *** Breast or nipple changes? {yes/no:20286} *** Contraception use? {yes/no:20286} *** Sexually active? {yes/no:20286} ***  Used the gel previously and did not see a change  Was prescribed the gel during the admission   Has bee3n having it regardless.   Will do clinda if BV positive > w/ diflucan  States recoccuring odor with discharge (looks like normal discharge). Was treated before. Odor has only been since hospitalization . Will use boric acid and then will "ear off" in a few hours. Will feel a smell from the urine along with discharge with the same smell. No pain. Having regular menstrual cycles. Odor is not quite fishy, just not normal, like bad bacteria. No fever, chills, or  nause or vomiting.   WAS TREATED BEFORE . TRIED BORIC ACID -0 IT WORKS BUT DOESN'T LAST. SWITCHED TO DIAL BUT THEN SWITCHED BACK TO DOV. NOT SUR EIF SHE NEEDS A lCEANS".   Medical abortion earlier this year - had rPOC, required hospitalization and blood transfusion x2 due to prolonged bleeding beforehand  Patient's last menstrual period was 03/29/2023.   The pregnancy intention screening data noted above was reviewed. Potential methods of contraception were discussed. The patient elected to proceed with No data recorded.   Last pap No results found for: "DIAGPAP", "HPVHIGH", "ADEQPAP"   H/O abnormal pap: {yes/yes***/no:23866} Last mammogram: ***.  Last colonoscopy: ***.      08/11/2021   10:25 AM 07/05/2021   10:43 AM 06/28/2021    1:32 PM  Depression screen PHQ 2/9  Decreased Interest 0 3 3  Down, Depressed, Hopeless 1 3 3   PHQ - 2 Score 1 6 6   Altered sleeping 0 1 1   Tired, decreased energy 0 3 3  Change in appetite 0 3 3  Feeling bad or failure about yourself  0 0 0  Trouble concentrating 0 1 0  Moving slowly or fidgety/restless 0 3 0  Suicidal thoughts 0 0 0  PHQ-9 Score 1 17 13         08/11/2021   10:27 AM 07/05/2021   10:43 AM 06/28/2021    1:31 PM  GAD 7 : Generalized Anxiety Score  Nervous, Anxious, on Edge 0 0 0  Control/stop worrying 1 1 3   Worry too much - different things 1 2 3   Trouble relaxing 0 1 3  Restless 0 0 0  Easily annoyed or irritable 0 2 1  Afraid - awful might happen 0 0 0  Total GAD 7 Score 2 6 10      Review of Systems:   Pertinent items are noted in HPI Denies any headaches, blurred vision, fatigue, shortness of breath, chest pain, abdominal pain, abnormal vaginal discharge/itching/odor/irritation, problems with periods, bowel movements, urination, or intercourse unless otherwise stated above. Pertinent History Reviewed:  Reviewed past medical,surgical, social and family history.  Reviewed problem list, medications and allergies. Physical Assessment:   Vitals:   04/23/23 1612  BP: 115/78  Pulse: 67  Weight: 141 lb 9.6 oz (64.2 kg)  Body mass index is 25.9 kg/m.        Physical Examination:   General appearance - well appearing, and in no distress  Mental status -  alert, oriented to person, place, and time  Psych:  She has a normal mood and affect  Skin - warm and dry, normal color, no suspicious lesions noted  Chest - effort normal, all lung fields clear to auscultation bilaterally  Heart - normal rate and regular rhythm  Breasts - breasts appear normal, no suspicious masses, no skin or nipple changes or  axillary nodes  Abdomen - soft, nontender, nondistended, no masses or organomegaly  Pelvic -  VULVA: normal appearing vulva with no masses, tenderness or lesions   VAGINA: normal appearing vagina with normal color and discharge, no lesions   CERVIX: normal appearing cervix without discharge or  lesions, no CMT  Thin prep pap is {Desc; done/not:10129} *** HR HPV cotesting  UTERUS: uterus is felt to be normal size, shape, consistency and nontender   ADNEXA: No adnexal masses or tenderness noted.  Extremities:  No swelling or varicosities noted  Chaperone present for exam  No results found for this or any previous visit (from the past 24 hour(s)).    Assessment & Plan:  There are no diagnoses linked to this encounter.      No orders of the defined types were placed in this encounter.   Meds: No orders of the defined types were placed in this encounter.   Follow-up: No follow-ups on file.  Lorriane Shire, MD 04/23/2023 4:17 PM

## 2023-04-24 LAB — POCT URINALYSIS DIP (DEVICE)
Bilirubin Urine: NEGATIVE
Glucose, UA: NEGATIVE mg/dL
Ketones, ur: NEGATIVE mg/dL
Nitrite: NEGATIVE
Protein, ur: NEGATIVE mg/dL
Specific Gravity, Urine: >=1.03 (ref 1.005–1.030)
Urobilinogen, UA: 0.2 mg/dL (ref 0.0–1.0)
pH: 5.5 (ref 5.0–8.0)

## 2023-04-25 ENCOUNTER — Other Ambulatory Visit: Payer: Self-pay | Admitting: Obstetrics and Gynecology

## 2023-04-25 DIAGNOSIS — N760 Acute vaginitis: Secondary | ICD-10-CM

## 2023-04-25 DIAGNOSIS — B3731 Acute candidiasis of vulva and vagina: Secondary | ICD-10-CM

## 2023-04-25 LAB — CERVICOVAGINAL ANCILLARY ONLY
Bacterial Vaginitis (gardnerella): POSITIVE — AB
Candida Glabrata: NEGATIVE
Candida Vaginitis: POSITIVE — AB
Chlamydia: NEGATIVE
Comment: NEGATIVE
Comment: NEGATIVE
Comment: NEGATIVE
Comment: NEGATIVE
Comment: NEGATIVE
Comment: NORMAL
Neisseria Gonorrhea: NEGATIVE
Trichomonas: NEGATIVE

## 2023-04-25 MED ORDER — FLUCONAZOLE 150 MG PO TABS
150.0000 mg | ORAL_TABLET | Freq: Once | ORAL | 3 refills | Status: AC
Start: 2023-04-25 — End: 2023-04-25

## 2023-04-25 MED ORDER — CLINDAMYCIN PHOSPHATE 2 % VA CREA
1.0000 | TOPICAL_CREAM | Freq: Every day | VAGINAL | 0 refills | Status: AC
Start: 2023-04-25 — End: 2023-05-02

## 2023-04-26 LAB — URINE CULTURE

## 2023-04-26 LAB — CYTOLOGY - PAP: Diagnosis: NEGATIVE

## 2023-04-30 ENCOUNTER — Other Ambulatory Visit: Payer: Medicaid Other

## 2023-06-09 ENCOUNTER — Emergency Department (HOSPITAL_BASED_OUTPATIENT_CLINIC_OR_DEPARTMENT_OTHER)
Admission: EM | Admit: 2023-06-09 | Discharge: 2023-06-09 | Disposition: A | Discharge status: Home / Self Care | Payer: Medicaid Other | Attending: Emergency Medicine | Admitting: Emergency Medicine

## 2023-06-09 ENCOUNTER — Other Ambulatory Visit: Payer: Self-pay

## 2023-06-09 ENCOUNTER — Encounter (HOSPITAL_BASED_OUTPATIENT_CLINIC_OR_DEPARTMENT_OTHER): Payer: Self-pay | Admitting: Emergency Medicine

## 2023-06-09 DIAGNOSIS — Z3202 Encounter for pregnancy test, result negative: Secondary | ICD-10-CM | POA: Insufficient documentation

## 2023-06-09 DIAGNOSIS — R519 Headache, unspecified: Secondary | ICD-10-CM | POA: Insufficient documentation

## 2023-06-09 DIAGNOSIS — R110 Nausea: Secondary | ICD-10-CM | POA: Diagnosis not present

## 2023-06-09 DIAGNOSIS — Z3200 Encounter for pregnancy test, result unknown: Secondary | ICD-10-CM | POA: Diagnosis present

## 2023-06-09 LAB — PREGNANCY, URINE: Preg Test, Ur: NEGATIVE

## 2023-06-09 MED ORDER — ACETAMINOPHEN 325 MG PO TABS
650.0000 mg | ORAL_TABLET | Freq: Once | ORAL | Status: DC
  Filled 2023-06-09: qty 2

## 2023-06-09 NOTE — ED Triage Notes (Addendum)
Intermittent headaches associated with nausea x 1 week. Concerned for pregnancy due to having same sx in past with pregnancy. Negative home test. No vomiting. LMP unknown.

## 2023-06-09 NOTE — Discharge Instructions (Signed)
It was a pleasure taking care of you here today.    Your pregnancy test was negative.    We offered labs and imaging which you declined  Make sure to follow-up outpatient, return for any worsening symptoms

## 2023-06-09 NOTE — ED Notes (Addendum)
Pt was marked for discharge, but left facility prior to receiving Tylenol or discharge papers.

## 2023-06-09 NOTE — ED Provider Notes (Signed)
[place] [facility] DEPARTMENT AT [facility] Provider Note   CSN: [id number] Arrival date & time: 06/09/23  1904     History  Chief Complaint  Patient presents with   Headache   Nausea    Patricia Craig is a 27 y.o. female here for evaluation of headache and nausea as well as concern for pregnancy.  She is unsure of her last LMP she states typically she gets headaches and nausea in pregnancy which is why she was concerned.  She has had intermittent headaches over the last week and a half.  No recent head injury.  No sudden onset thunderclap headache.  No fever, neck pain, congestion, rhinorrhea, sore throat, chest pain, shortness of breath, abdominal pain.  She has had some nausea that vomiting.  No urinary symptoms.   HPI     Home Medications Prior to Admission medications   Not on File      Allergies    Patient has no known allergies.    Review of Systems   Review of Systems  Constitutional: Negative.   HENT: Negative.    Respiratory: Negative.    Cardiovascular: Negative.   Gastrointestinal:  Positive for nausea. Negative for abdominal distention, abdominal pain, anal bleeding, blood in stool, constipation, diarrhea, rectal pain and vomiting.  Genitourinary: Negative.   Musculoskeletal: Negative.   Skin: Negative.   Neurological:  Positive for headaches.  All other systems reviewed and are negative.   Physical Exam Updated Vital Signs BP 130/79 (BP Location: Right Arm)   Pulse 85   Temp 97.7 F (36.5 C) (Oral)   Resp 16   Ht 5\' 2"  (1.575 m)   Wt 64 kg   LMP  (LMP Unknown)   SpO2 100%   BMI 25.79 kg/m  Physical Exam Physical Exam  Constitutional: Pt is oriented to person, place, and time. Pt appears well-developed and well-nourished. No distress.  HENT:  Head: Normocephalic and atraumatic.  Mouth/Throat: Oropharynx is clear and moist.  Eyes: Conjunctivae and EOM are normal. Pupils are equal, round, and reactive to light. No scleral  icterus.  No horizontal, vertical or rotational nystagmus  Neck: Normal range of motion. Neck supple.  Full active and passive ROM without pain No midline or paraspinal tenderness No nuchal rigidity or meningeal signs  Cardiovascular: Normal rate, regular rhythm and intact distal pulses.   Pulmonary/Chest: Effort normal and breath sounds normal. No respiratory distress. Pt has no wheezes. No rales.  Abdominal: Soft. Bowel sounds are normal. There is no tenderness. There is no rebound and no guarding.  Musculoskeletal: Normal range of motion.  Lymphadenopathy:    No cervical adenopathy.  Neurological: Pt. is alert and oriented to person, place, and time. He has normal reflexes. No cranial nerve deficit.  Exhibits normal muscle tone. Coordination normal.  Mental Status:  Alert, oriented, thought content appropriate. Speech fluent without evidence of aphasia. Able to follow 2 step commands without difficulty.  Cranial Nerves:  2-12 grossly intact Motor:  Equal strength Sensory: intact sensation Cerebellar: normal F2N Gait: normal gait and balance CV: distal pulses palpable throughout   Skin: Skin is warm and dry. No rash noted. Pt is not diaphoretic.  Psychiatric: Pt has a normal mood and affect. Behavior is normal. Judgment and thought content normal.  Nursing note and vitals reviewed.  ED Results / Procedures / Treatments   Labs (all labs ordered are listed, but only abnormal results are displayed) Labs Reviewed  PREGNANCY, URINE    EKG None  Radiology No results found.  Procedures Procedures    Medications Ordered in ED Medications  acetaminophen (TYLENOL) tablet 650 mg (has no administration in time range)    ED Course/ Medical Decision Making/ A&P   27 year old here for evaluation of headache and nausea intermittent over the last week or so.  No recent head trauma.  She has a nonfocal neuroexam without deficits.  Her abdomen is soft, nontender.  No URI symptoms.   No fever, neck pain, neck stiffness have low suspicion for meningitis.  Her abdomen is soft, nontender.  No UTI symptoms.  No vaginal bleeding, changes in stools.  She was concerned that she typically has the symptoms when she is pregnant and she is unsure of her last LMP.  She appears otherwise well.  I offered head imaging, labs.  Patient declines.  She states she was only here to see if she was pregnant.  Labs personally viewed interpreted Pregnancy test negative.  I encourage patient to return if she has continued symptoms given she declined further workup here.  The patient has been appropriately medically screened and/or stabilized in the ED. I have low suspicion for any other emergent medical condition which would require further screening, evaluation or treatment in the ED or require inpatient management.  Patient is hemodynamically stable and in no acute distress.  Patient able to ambulate in department prior to ED.  Evaluation does not show acute pathology that would require ongoing or additional emergent interventions while in the emergency department or further inpatient treatment.  I have discussed the diagnosis with the patient and answered all questions.  Pain is been managed while in the emergency department and patient has no further complaints prior to discharge.  Patient is comfortable with plan discussed in room and is stable for discharge at this time.  I have discussed strict return precautions for returning to the emergency department.  Patient was encouraged to follow-up with PCP/specialist refer to at discharge.                                 Medical Decision Making Amount and/or Complexity of Data Reviewed External Data Reviewed: labs, radiology and notes. Labs: ordered. Decision-making details documented in ED Course.  Risk OTC drugs. Decision regarding hospitalization. Diagnosis or treatment significantly limited by social determinants of  health.          Final Clinical Impression(s) / ED Diagnoses Final diagnoses:  Encounter for pregnancy test with result negative    Rx / DC Orders ED Discharge Orders     None         Toney Lizaola A, PA-C 06/09/23 2001    Virgina Norfolk, DO 06/09/23 2301

## 2023-08-24 ENCOUNTER — Other Ambulatory Visit: Payer: Self-pay

## 2023-08-24 ENCOUNTER — Emergency Department (HOSPITAL_BASED_OUTPATIENT_CLINIC_OR_DEPARTMENT_OTHER)
Admission: EM | Admit: 2023-08-24 | Discharge: 2023-08-24 | Disposition: A | Discharge status: Home / Self Care | Payer: Medicaid Other | Attending: Emergency Medicine | Admitting: Emergency Medicine

## 2023-08-24 ENCOUNTER — Other Ambulatory Visit (HOSPITAL_BASED_OUTPATIENT_CLINIC_OR_DEPARTMENT_OTHER): Payer: Self-pay

## 2023-08-24 ENCOUNTER — Emergency Department (HOSPITAL_BASED_OUTPATIENT_CLINIC_OR_DEPARTMENT_OTHER): Payer: Medicaid Other | Admitting: Radiology

## 2023-08-24 ENCOUNTER — Encounter (HOSPITAL_BASED_OUTPATIENT_CLINIC_OR_DEPARTMENT_OTHER): Payer: Self-pay

## 2023-08-24 DIAGNOSIS — R059 Cough, unspecified: Secondary | ICD-10-CM | POA: Diagnosis present

## 2023-08-24 DIAGNOSIS — J111 Influenza due to unidentified influenza virus with other respiratory manifestations: Secondary | ICD-10-CM

## 2023-08-24 DIAGNOSIS — J101 Influenza due to other identified influenza virus with other respiratory manifestations: Secondary | ICD-10-CM | POA: Insufficient documentation

## 2023-08-24 DIAGNOSIS — Z20822 Contact with and (suspected) exposure to covid-19: Secondary | ICD-10-CM | POA: Insufficient documentation

## 2023-08-24 LAB — RESP PANEL BY RT-PCR (RSV, FLU A&B, COVID)  RVPGX2
Influenza A by PCR: POSITIVE — AB
Influenza B by PCR: NEGATIVE
Resp Syncytial Virus by PCR: NEGATIVE
SARS Coronavirus 2 by RT PCR: NEGATIVE

## 2023-08-24 LAB — GROUP A STREP BY PCR: Group A Strep by PCR: NOT DETECTED

## 2023-08-24 LAB — PREGNANCY, URINE: Preg Test, Ur: NEGATIVE

## 2023-08-24 MED ORDER — ALBUTEROL SULFATE HFA 108 (90 BASE) MCG/ACT IN AERS
1.0000 | INHALATION_SPRAY | Freq: Four times a day (QID) | RESPIRATORY_TRACT | 0 refills | Status: AC | PRN
  Filled 2023-08-24: qty 6.7, 25d supply, fill #0

## 2023-08-24 MED ORDER — IBUPROFEN 400 MG PO TABS
600.0000 mg | ORAL_TABLET | Freq: Once | ORAL | Status: AC
  Administered 2023-08-24: 600 mg via ORAL
  Filled 2023-08-24: qty 1

## 2023-08-24 MED ORDER — FLUTICASONE PROPIONATE 50 MCG/ACT NA SUSP
2.0000 | Freq: Every day | NASAL | 2 refills | Status: AC
  Filled 2023-08-24: qty 16, 30d supply, fill #0

## 2023-08-24 MED ORDER — BENZONATATE 100 MG PO CAPS
100.0000 mg | ORAL_CAPSULE | Freq: Three times a day (TID) | ORAL | 0 refills | Status: AC | PRN
  Filled 2023-08-24: qty 10, 4d supply, fill #0

## 2023-08-24 MED ORDER — ACETAMINOPHEN 500 MG PO TABS
1000.0000 mg | ORAL_TABLET | Freq: Once | ORAL | Status: AC
  Administered 2023-08-24: 1000 mg via ORAL
  Filled 2023-08-24: qty 2

## 2023-08-24 NOTE — ED Triage Notes (Signed)
 Patient arrives with complaints of fever and cough x4 days. Patient states that her child is positive for the flu. She was tested for flu earlier this week, but results were negative. Now reporting worsening symptoms.

## 2023-08-24 NOTE — ED Notes (Signed)
Patient given discharge instructions. Questions were answered. Patient verbalized understanding of discharge instructions and care at home.  Discharged with family 

## 2023-08-24 NOTE — ED Provider Notes (Signed)
 [place] EMERGENCY DEPARTMENT AT [facility] Provider Note   CSN: [id number] Arrival date & time: 08/24/23  9093     History Chief Complaint  Patient presents with   Cough   Fever    Patricia Craig is a 28 y.o. female emergency room today for evaluation conical symptoms for 4 days.  Patient reports send recently tested positive for flu.  She endorses a productive cough with green mucus.  Runny nose and a congestion.  Reports diffuse bodyaches.  Denies any chest pain or shortness of breath.  Reports fatigue.  Has not taking any medications other than at 0500 today she tried Tylenol .  She reports that she has been feeling fever and chills however has not been measuring her temperature at home.  She denies abdominal pain, nausea, vomiting, or diarrhea.  Reports a sore throat, but no trouble swallowing.  Cough Associated symptoms: chills, fever and sore throat   Associated symptoms: no chest pain, no rhinorrhea and no shortness of breath   Fever Associated symptoms: chills, cough and sore throat   Associated symptoms: no chest pain, no congestion, no diarrhea, no nausea, no rhinorrhea and no vomiting        Home Medications Prior to Admission medications   Not on File      Allergies    Patient has no known allergies.    Review of Systems   Review of Systems  Constitutional:  Positive for chills, fatigue and fever.  HENT:  Positive for sore throat. Negative for congestion and rhinorrhea.   Respiratory:  Positive for cough. Negative for shortness of breath.   Cardiovascular:  Negative for chest pain.  Gastrointestinal:  Negative for abdominal pain, constipation, diarrhea, nausea and vomiting.  Neurological:  Negative for syncope.    Physical Exam Updated Vital Signs BP 107/71   Pulse (!) 109   Temp 100.3 F (37.9 C) (Oral)   Resp 19   Ht 5' 2 (1.575 m)   Wt 68 kg   LMP 08/24/2023   SpO2 99%   BMI 27.44 kg/m  Physical Exam Vitals and nursing note  reviewed.  Constitutional:      General: She is not in acute distress.    Appearance: She is not toxic-appearing.     Comments: Uncomfortable, nontoxic-appearing  HENT:     Head: Normocephalic and atraumatic.     Right Ear: Tympanic membrane, ear canal and external ear normal.     Left Ear: Tympanic membrane, ear canal and external ear normal.     Nose:     Comments: Bilateral nasal turbinate edema and erythema with scant clear nasal discharge.    Mouth/Throat:     Mouth: Mucous membranes are moist.     Comments: No pharyngeal erythema, exudate, or edema noted.  Uvula midline.  Airway patent.  Moist mucous membranes. Eyes:     General: No scleral icterus.    Extraocular Movements: Extraocular movements intact.     Conjunctiva/sclera: Conjunctivae normal.     Pupils: Pupils are equal, round, and reactive to light.  Cardiovascular:     Rate and Rhythm: Tachycardia present.     Comments: Mild tachycardia Pulmonary:     Effort: Pulmonary effort is normal. No respiratory distress.     Breath sounds: Normal breath sounds. No wheezing or rhonchi.  Abdominal:     General: Abdomen is flat.     Palpations: Abdomen is soft.     Tenderness: There is no abdominal tenderness.  Musculoskeletal:  General: No deformity.     Cervical back: Normal range of motion. No rigidity.  Skin:    General: Skin is warm and dry.  Neurological:     General: No focal deficit present.     Mental Status: She is alert.     ED Results / Procedures / Treatments   Labs (all labs ordered are listed, but only abnormal results are displayed) Labs Reviewed  RESP PANEL BY RT-PCR (RSV, FLU A&B, COVID)  RVPGX2 - Abnormal; Notable for the following components:      Result Value   Influenza A by PCR POSITIVE (*)    All other components within normal limits  GROUP A STREP BY PCR  PREGNANCY, URINE    EKG None  Radiology DG Chest 2 View Result Date: 08/24/2023 CLINICAL DATA:  Cough. EXAM: CHEST - 2 VIEW  COMPARISON:  None Available. FINDINGS: The heart size and mediastinal contours are within normal limits. Both lungs are clear. The visualized skeletal structures are unremarkable. IMPRESSION: No active cardiopulmonary disease. Electronically Signed   By: Bard Moats M.D.   On: 08/24/2023 09:59    Procedures Procedures   Medications Ordered in ED Medications  acetaminophen  (TYLENOL ) tablet 1,000 mg (has no administration in time range)  ibuprofen  (ADVIL ) tablet 600 mg (600 mg Oral Given 08/24/23 9041)    ED Course/ Medical Decision Making/ A&P    Medical Decision Making Amount and/or Complexity of Data Reviewed Labs: ordered. Radiology: ordered.  Risk OTC drugs. Prescription drug management.   28 y.o. female presents to the ER for evaluation of cough and cold symptoms. Differential diagnosis includes but is not limited to viral illness, COVID, flu, RSV, bronchitis, PNA. Vital signs presentation, temperature is 21.7, tachycardia 116.  Normal blood pressure, satting on room air without increased work of breathing. Physical exam as noted above.   I independently reviewed and interpreted the patient's labs.  Patient is positive for influenza A.  Negative for COVID, RSV, and strep.  XR shows No active cardiopulmonary disease. Per radiologist's interpretation.    Unfortunately, patient is out of the window for Tamiflu discussed with her about over-the-counter cough and cold medications.  Will prescribe her some Flonase  for nasal congestion, albuterol  to help her with her frequent coughing as well as some Tessalon  Perles.  Commended over-the-counter Alka-Seltzer Tylenol  cough and blue cold and sinus etc.  Discussed about making sure the abdomen gradient has Tylenol  and to not take too much Tylenol .  Recommended discussing this with pharmacist for additional questions.  Overall, her temperature is gone down with Tylenol  and Motrin .  Still mildly tachycardic however with a fever face.  She has no  nuchal rigidity, abdomen is soft and nontender.  Throat does not show any edema, erythema, or exudate.  Recommended supportive care measures and above with primary care doctor.  Strict return precautions and red flag symptoms discussed  We discussed the results of the labs/imaging. The plan is supportive care. We discussed strict return precautions and red flag symptoms. The patient verbalized their understanding and agrees to the plan. The patient is stable and being discharged home in good condition.  Portions of this report may have been transcribed using voice recognition software. Every effort was made to ensure accuracy; however, inadvertent computerized transcription errors may be present.   Final Clinical Impression(s) / ED Diagnoses Final diagnoses:  Flu    Rx / DC Orders ED Discharge Orders          Ordered  benzonatate  (TESSALON ) 100 MG capsule  Every 8 hours PRN        08/24/23 1108    fluticasone  (FLONASE ) 50 MCG/ACT nasal spray  Daily        08/24/23 1108    albuterol  (VENTOLIN  HFA) 108 (90 Base) MCG/ACT inhaler  Every 6 hours PRN        08/24/23 1108              Bernis Ernst, PA-C 08/24/23 1116    Bernard Drivers, MD 08/25/23 1242

## 2023-08-24 NOTE — Discharge Instructions (Addendum)
 You have the flu this is a viral infection that will likely start to improve after 7-10 days, antibiotics are not helpful in treating viral infections.  Since your symptoms have been present for more than 2 days Tamiflu will give no additional benefit.  Please make sure you are drinking plenty of fluids. You can treat your symptoms supportively with tylenol  650 mg/1000mg  and ibuprofen  600 mg every 6 hours for fevers and pains.  He can also try over-the-counter cough and cold medication.  Please be careful as these often contain Tylenol  illness and do not want to take much Tylenol .  You can ask your pharmacy for listed medications.  For nasal congestion you can use Zyrtec and Flonase  to help with nasal congestion. To treat cough you can use over the counter cough medications such as Alk Seltzer cold and sinus or Robitussin and throat lozenges. If your symptoms are not improving please follow up with you Primary doctor. If you have any concerns, new or worsening symptoms, please return to the emergency department for evaluation.   If you develop persistent fevers, shortness of breath or difficulty breathing, chest pain, severe headache and neck pain, persistent nausea and vomiting or other new or concerning symptoms return to the Emergency department.   Contact a health care provider if: You get new symptoms. You have chest pain. You have watery poop, also called diarrhea. You have a fever. Your cough gets worse. You start to have more mucus. You feel like you may vomit, or you vomit. Get help right away if: You become short of breath or have trouble breathing. Your skin or nails turn blue. You have very bad pain or stiffness in your neck. You get a sudden headache or pain in your face or ear. You vomit each time you eat or drink. These symptoms may be an emergency. Call 911 right away. Do not wait to see if the symptoms will go away. Do not drive yourself to the hospital.

## 2023-11-28 ENCOUNTER — Other Ambulatory Visit: Payer: Self-pay

## 2023-11-28 ENCOUNTER — Emergency Department (HOSPITAL_BASED_OUTPATIENT_CLINIC_OR_DEPARTMENT_OTHER)
Admission: EM | Admit: 2023-11-28 | Discharge: 2023-11-28 | Disposition: A | Discharge status: Home / Self Care | Attending: Emergency Medicine | Admitting: Emergency Medicine

## 2023-11-28 ENCOUNTER — Encounter (HOSPITAL_BASED_OUTPATIENT_CLINIC_OR_DEPARTMENT_OTHER): Payer: Self-pay | Admitting: Emergency Medicine

## 2023-11-28 DIAGNOSIS — Z3202 Encounter for pregnancy test, result negative: Secondary | ICD-10-CM | POA: Insufficient documentation

## 2023-11-28 DIAGNOSIS — N926 Irregular menstruation, unspecified: Secondary | ICD-10-CM

## 2023-11-28 LAB — PREGNANCY, URINE: Preg Test, Ur: NEGATIVE

## 2023-11-28 NOTE — Discharge Instructions (Signed)
 Turn for any new or worsening symptoms

## 2023-11-28 NOTE — ED Triage Notes (Signed)
 Patient reports missing 1 period and requesting pregnancy test. LMP mid march - unknown exact date. Denies pain.

## 2023-11-28 NOTE — ED Provider Notes (Signed)
  [place] EMERGENCY DEPARTMENT AT [facility] Provider Note   CSN: [id number] Arrival date & time: 11/28/23  2002     History  Chief Complaint  Patient presents with   Possible Pregnancy    Patricia Craig is a 28 y.o. female.  Who missed her menstrual cycle and just wanted to know if she was pregnant so came to the emergency department for pregnancy test.  She has no other complaints.   Possible Pregnancy       Home Medications Prior to Admission medications   Medication Sig Start Date End Date Taking? Authorizing Provider  albuterol  (VENTOLIN  HFA) 108 (90 Base) MCG/ACT inhaler Inhale 1-2 puffs into the lungs every 6 (six) hours as needed for wheezing or shortness of breath. 08/24/23   Spence Dux, PA-C  benzonatate  (TESSALON ) 100 MG capsule Take 1 capsule (100 mg total) by mouth every 8 (eight) hours as needed for cough. 08/24/23   Spence Dux, PA-C  fluticasone  (FLONASE ) 50 MCG/ACT nasal spray Place 2 sprays into both nostrils daily. 08/24/23   Spence Dux, PA-C      Allergies    Patient has no known allergies.    Review of Systems   Review of Systems  Physical Exam Updated Vital Signs BP 122/86 (BP Location: Right Arm)   Pulse 62   Temp 98.2 F (36.8 C)   Resp 18   Ht 5\' 2"  (1.575 m)   Wt 68.9 kg   SpO2 100%   BMI 27.80 kg/m  Physical Exam HENT:     Head: Normocephalic.     Nose: Nose normal.  Eyes:     General: No scleral icterus. Pulmonary:     Effort: Pulmonary effort is normal.  Abdominal:     General: There is no distension.  Skin:    Coloration: Skin is not jaundiced.  Neurological:     Mental Status: She is alert and oriented to person, place, and time.  Psychiatric:        Mood and Affect: Mood normal.     ED Results / Procedures / Treatments   Labs (all labs ordered are listed, but only abnormal results are displayed) Labs Reviewed  PREGNANCY, URINE    EKG None  Radiology No results  found.  Procedures Procedures    Medications Ordered in ED Medications - No data to display  ED Course/ Medical Decision Making/ A&P                                 Medical Decision Making Amount and/or Complexity of Data Reviewed Labs: ordered.   Patient has a negative pregnancy test.  She is discharged follow-up with her primary care physician no other complaints at this time.        Final Clinical Impression(s) / ED Diagnoses Final diagnoses:  None    Rx / DC Orders ED Discharge Orders     None         Tama Fails, PA-C 11/28/23 2103    Guadalupe Lee, MD 12/04/23 [phone]

## 2024-01-24 ENCOUNTER — Ambulatory Visit
